# Patient Record
Sex: Female | Born: 1975 | Race: Black or African American | Hispanic: No | Smoking: Never smoker
Health system: Southern US, Community
[De-identification: ages and names within clinical notes are randomized; demographics above are authoritative.]

## PROBLEM LIST (undated history)

## (undated) DIAGNOSIS — N923 Ovulation bleeding: Secondary | ICD-10-CM

## (undated) DIAGNOSIS — E559 Vitamin D deficiency, unspecified: Secondary | ICD-10-CM

## (undated) DIAGNOSIS — E8881 Metabolic syndrome: Secondary | ICD-10-CM

## (undated) DIAGNOSIS — D219 Benign neoplasm of connective and other soft tissue, unspecified: Secondary | ICD-10-CM

## (undated) DIAGNOSIS — F419 Anxiety disorder, unspecified: Secondary | ICD-10-CM

## (undated) DIAGNOSIS — I890 Lymphedema, not elsewhere classified: Secondary | ICD-10-CM

## (undated) DIAGNOSIS — E7800 Pure hypercholesterolemia, unspecified: Secondary | ICD-10-CM

## (undated) DIAGNOSIS — O139 Gestational [pregnancy-induced] hypertension without significant proteinuria, unspecified trimester: Secondary | ICD-10-CM

## (undated) DIAGNOSIS — E88819 Insulin resistance, unspecified: Secondary | ICD-10-CM

## (undated) DIAGNOSIS — N840 Polyp of corpus uteri: Secondary | ICD-10-CM

## (undated) DIAGNOSIS — N760 Acute vaginitis: Secondary | ICD-10-CM

## (undated) DIAGNOSIS — I10 Essential (primary) hypertension: Secondary | ICD-10-CM

## (undated) DIAGNOSIS — A5901 Trichomonal vulvovaginitis: Secondary | ICD-10-CM

## (undated) DIAGNOSIS — O1410 Severe pre-eclampsia, unspecified trimester: Secondary | ICD-10-CM

## (undated) DIAGNOSIS — N920 Excessive and frequent menstruation with regular cycle: Secondary | ICD-10-CM

## (undated) DIAGNOSIS — D6859 Other primary thrombophilia: Secondary | ICD-10-CM

## (undated) DIAGNOSIS — D759 Disease of blood and blood-forming organs, unspecified: Secondary | ICD-10-CM

## (undated) DIAGNOSIS — B9689 Other specified bacterial agents as the cause of diseases classified elsewhere: Secondary | ICD-10-CM

## (undated) HISTORY — DX: Lymphedema, not elsewhere classified: I89.0

## (undated) HISTORY — DX: Benign neoplasm of connective and other soft tissue, unspecified: D21.9

## (undated) HISTORY — DX: Other specified bacterial agents as the cause of diseases classified elsewhere: B96.89

## (undated) HISTORY — DX: Excessive and frequent menstruation with regular cycle: N92.0

## (undated) HISTORY — PX: WISDOM TOOTH EXTRACTION: SHX21

## (undated) HISTORY — DX: Vitamin D deficiency, unspecified: E55.9

## (undated) HISTORY — DX: Anxiety disorder, unspecified: F41.9

## (undated) HISTORY — DX: Trichomonal vulvovaginitis: A59.01

## (undated) HISTORY — PX: ANKLE SURGERY: SHX546

## (undated) HISTORY — DX: Acute vaginitis: N76.0

## (undated) HISTORY — DX: Polyp of corpus uteri: N84.0

## (undated) HISTORY — DX: Ovulation bleeding: N92.3

---

## 1999-04-22 ENCOUNTER — Emergency Department (HOSPITAL_COMMUNITY): Admission: EM | Admit: 1999-04-22 | Discharge: 1999-04-22 | Payer: Self-pay | Admitting: Emergency Medicine

## 1999-04-22 ENCOUNTER — Encounter: Payer: Self-pay | Admitting: Emergency Medicine

## 1999-09-11 ENCOUNTER — Emergency Department (HOSPITAL_COMMUNITY): Admission: EM | Admit: 1999-09-11 | Discharge: 1999-09-11 | Payer: Self-pay | Admitting: Emergency Medicine

## 1999-12-27 ENCOUNTER — Other Ambulatory Visit: Admission: RE | Admit: 1999-12-27 | Discharge: 1999-12-27 | Payer: Self-pay | Admitting: Obstetrics and Gynecology

## 2001-01-08 ENCOUNTER — Other Ambulatory Visit: Admission: RE | Admit: 2001-01-08 | Discharge: 2001-01-08 | Payer: Self-pay | Admitting: Obstetrics and Gynecology

## 2001-04-03 ENCOUNTER — Encounter: Payer: Self-pay | Admitting: Internal Medicine

## 2001-04-03 ENCOUNTER — Encounter: Admission: RE | Admit: 2001-04-03 | Discharge: 2001-04-03 | Payer: Self-pay | Admitting: Internal Medicine

## 2001-07-17 ENCOUNTER — Encounter: Payer: Self-pay | Admitting: *Deleted

## 2001-07-17 ENCOUNTER — Ambulatory Visit (HOSPITAL_COMMUNITY): Admission: RE | Admit: 2001-07-17 | Discharge: 2001-07-17 | Payer: Self-pay | Admitting: *Deleted

## 2001-08-31 ENCOUNTER — Emergency Department (HOSPITAL_COMMUNITY): Admission: EM | Admit: 2001-08-31 | Discharge: 2001-08-31 | Payer: Self-pay | Admitting: Emergency Medicine

## 2002-02-04 ENCOUNTER — Encounter: Payer: Self-pay | Admitting: *Deleted

## 2002-02-04 ENCOUNTER — Ambulatory Visit (HOSPITAL_COMMUNITY): Admission: RE | Admit: 2002-02-04 | Discharge: 2002-02-04 | Payer: Self-pay | Admitting: *Deleted

## 2002-03-03 ENCOUNTER — Inpatient Hospital Stay (HOSPITAL_COMMUNITY): Admission: RE | Admit: 2002-03-03 | Discharge: 2002-03-06 | Payer: Self-pay | Admitting: *Deleted

## 2002-11-09 ENCOUNTER — Emergency Department (HOSPITAL_COMMUNITY): Admission: EM | Admit: 2002-11-09 | Discharge: 2002-11-09 | Payer: Self-pay | Admitting: Emergency Medicine

## 2002-11-09 ENCOUNTER — Encounter: Payer: Self-pay | Admitting: Emergency Medicine

## 2002-11-17 ENCOUNTER — Ambulatory Visit (HOSPITAL_BASED_OUTPATIENT_CLINIC_OR_DEPARTMENT_OTHER): Admission: RE | Admit: 2002-11-17 | Discharge: 2002-11-17 | Payer: Self-pay | Admitting: Orthopedic Surgery

## 2003-03-16 ENCOUNTER — Ambulatory Visit (HOSPITAL_BASED_OUTPATIENT_CLINIC_OR_DEPARTMENT_OTHER): Admission: RE | Admit: 2003-03-16 | Discharge: 2003-03-16 | Payer: Self-pay | Admitting: Orthopedic Surgery

## 2003-04-14 ENCOUNTER — Other Ambulatory Visit: Admission: RE | Admit: 2003-04-14 | Discharge: 2003-04-14 | Payer: Self-pay | Admitting: Obstetrics and Gynecology

## 2004-02-04 ENCOUNTER — Other Ambulatory Visit: Admission: RE | Admit: 2004-02-04 | Discharge: 2004-02-04 | Payer: Self-pay | Admitting: Obstetrics and Gynecology

## 2004-02-10 ENCOUNTER — Inpatient Hospital Stay (HOSPITAL_COMMUNITY): Admission: AD | Admit: 2004-02-10 | Discharge: 2004-02-10 | Payer: Self-pay | Admitting: Obstetrics and Gynecology

## 2004-03-16 ENCOUNTER — Encounter: Admission: RE | Admit: 2004-03-16 | Discharge: 2004-06-15 | Payer: Self-pay | Admitting: Obstetrics and Gynecology

## 2004-03-26 ENCOUNTER — Inpatient Hospital Stay (HOSPITAL_COMMUNITY): Admission: AD | Admit: 2004-03-26 | Discharge: 2004-03-26 | Payer: Self-pay | Admitting: Obstetrics & Gynecology

## 2004-03-27 ENCOUNTER — Encounter (HOSPITAL_COMMUNITY): Admission: RE | Admit: 2004-03-27 | Discharge: 2004-04-26 | Payer: Self-pay | Admitting: Obstetrics and Gynecology

## 2004-04-29 ENCOUNTER — Encounter (HOSPITAL_COMMUNITY): Admission: RE | Admit: 2004-04-29 | Discharge: 2004-05-29 | Payer: Self-pay | Admitting: Obstetrics and Gynecology

## 2004-04-30 ENCOUNTER — Inpatient Hospital Stay (HOSPITAL_COMMUNITY): Admission: AD | Admit: 2004-04-30 | Discharge: 2004-04-30 | Payer: Self-pay | Admitting: Obstetrics & Gynecology

## 2004-05-11 ENCOUNTER — Inpatient Hospital Stay (HOSPITAL_COMMUNITY): Admission: AD | Admit: 2004-05-11 | Discharge: 2004-05-12 | Payer: Self-pay | Admitting: Obstetrics & Gynecology

## 2004-06-14 ENCOUNTER — Ambulatory Visit: Payer: Self-pay | Admitting: Obstetrics and Gynecology

## 2004-06-15 ENCOUNTER — Ambulatory Visit: Payer: Self-pay | Admitting: Obstetrics & Gynecology

## 2004-06-21 ENCOUNTER — Ambulatory Visit: Payer: Self-pay | Admitting: Obstetrics & Gynecology

## 2004-06-26 ENCOUNTER — Inpatient Hospital Stay (HOSPITAL_COMMUNITY): Admission: AD | Admit: 2004-06-26 | Discharge: 2004-06-26 | Payer: Self-pay | Admitting: Obstetrics and Gynecology

## 2004-06-26 ENCOUNTER — Ambulatory Visit: Payer: Self-pay | Admitting: Obstetrics and Gynecology

## 2004-07-03 ENCOUNTER — Ambulatory Visit: Payer: Self-pay | Admitting: Obstetrics & Gynecology

## 2004-07-10 ENCOUNTER — Ambulatory Visit: Payer: Self-pay | Admitting: Obstetrics and Gynecology

## 2004-07-17 ENCOUNTER — Ambulatory Visit: Payer: Self-pay | Admitting: Obstetrics & Gynecology

## 2004-07-24 ENCOUNTER — Ambulatory Visit: Payer: Self-pay | Admitting: Obstetrics & Gynecology

## 2004-07-28 ENCOUNTER — Inpatient Hospital Stay (HOSPITAL_COMMUNITY): Admission: AD | Admit: 2004-07-28 | Discharge: 2004-07-28 | Payer: Self-pay | Admitting: Obstetrics and Gynecology

## 2004-07-29 ENCOUNTER — Inpatient Hospital Stay (HOSPITAL_COMMUNITY): Admission: AD | Admit: 2004-07-29 | Discharge: 2004-07-29 | Payer: Self-pay | Admitting: Obstetrics & Gynecology

## 2004-08-02 ENCOUNTER — Ambulatory Visit: Payer: Self-pay | Admitting: Obstetrics and Gynecology

## 2004-08-11 ENCOUNTER — Inpatient Hospital Stay (HOSPITAL_COMMUNITY): Admission: AD | Admit: 2004-08-11 | Discharge: 2004-08-16 | Payer: Self-pay | Admitting: Obstetrics and Gynecology

## 2004-08-11 ENCOUNTER — Ambulatory Visit: Payer: Self-pay | Admitting: Obstetrics and Gynecology

## 2004-08-13 ENCOUNTER — Encounter: Payer: Self-pay | Admitting: Obstetrics and Gynecology

## 2004-08-31 ENCOUNTER — Ambulatory Visit: Payer: Self-pay | Admitting: Hematology & Oncology

## 2004-12-29 ENCOUNTER — Emergency Department (HOSPITAL_COMMUNITY): Admission: EM | Admit: 2004-12-29 | Discharge: 2004-12-29 | Payer: Self-pay | Admitting: Emergency Medicine

## 2005-06-22 ENCOUNTER — Emergency Department (HOSPITAL_COMMUNITY): Admission: EM | Admit: 2005-06-22 | Discharge: 2005-06-22 | Payer: Self-pay | Admitting: Emergency Medicine

## 2005-06-24 ENCOUNTER — Emergency Department (HOSPITAL_COMMUNITY): Admission: EM | Admit: 2005-06-24 | Discharge: 2005-06-25 | Payer: Self-pay | Admitting: *Deleted

## 2005-10-05 ENCOUNTER — Other Ambulatory Visit: Admission: RE | Admit: 2005-10-05 | Discharge: 2005-10-05 | Payer: Self-pay | Admitting: Obstetrics and Gynecology

## 2006-09-24 DIAGNOSIS — N923 Ovulation bleeding: Secondary | ICD-10-CM

## 2006-09-24 DIAGNOSIS — D219 Benign neoplasm of connective and other soft tissue, unspecified: Secondary | ICD-10-CM

## 2006-09-24 HISTORY — DX: Ovulation bleeding: N92.3

## 2006-09-24 HISTORY — DX: Benign neoplasm of connective and other soft tissue, unspecified: D21.9

## 2010-09-24 DIAGNOSIS — N920 Excessive and frequent menstruation with regular cycle: Secondary | ICD-10-CM

## 2010-09-24 DIAGNOSIS — E559 Vitamin D deficiency, unspecified: Secondary | ICD-10-CM

## 2010-09-24 DIAGNOSIS — N840 Polyp of corpus uteri: Secondary | ICD-10-CM

## 2010-09-24 DIAGNOSIS — A5901 Trichomonal vulvovaginitis: Secondary | ICD-10-CM

## 2010-09-24 HISTORY — DX: Polyp of corpus uteri: N84.0

## 2010-09-24 HISTORY — DX: Trichomonal vulvovaginitis: A59.01

## 2010-09-24 HISTORY — DX: Excessive and frequent menstruation with regular cycle: N92.0

## 2010-09-24 HISTORY — DX: Vitamin D deficiency, unspecified: E55.9

## 2011-03-23 ENCOUNTER — Encounter (HOSPITAL_COMMUNITY): Payer: Self-pay | Admitting: *Deleted

## 2011-04-08 MED ORDER — CEFAZOLIN SODIUM-DEXTROSE 2-3 GM-% IV SOLR
2.0000 g | INTRAVENOUS | Status: AC
  Administered 2011-04-09: 2 g via INTRAVENOUS
  Filled 2011-04-08: qty 50

## 2011-04-09 ENCOUNTER — Encounter (HOSPITAL_COMMUNITY): Payer: Self-pay | Admitting: Anesthesiology

## 2011-04-09 ENCOUNTER — Encounter (HOSPITAL_COMMUNITY)
Admission: RE | Disposition: A | Discharge status: Home / Self Care | Payer: Self-pay | Source: Ambulatory Visit | Attending: Obstetrics and Gynecology

## 2011-04-09 ENCOUNTER — Other Ambulatory Visit: Payer: Self-pay | Admitting: Obstetrics and Gynecology

## 2011-04-09 ENCOUNTER — Ambulatory Visit (HOSPITAL_COMMUNITY)
Admission: RE | Admit: 2011-04-09 | Discharge: 2011-04-09 | Disposition: A | Discharge status: Home / Self Care | Payer: 59 | Source: Ambulatory Visit | Attending: Obstetrics and Gynecology | Admitting: Obstetrics and Gynecology

## 2011-04-09 ENCOUNTER — Ambulatory Visit (HOSPITAL_COMMUNITY): Payer: 59 | Admitting: Anesthesiology

## 2011-04-09 DIAGNOSIS — Z302 Encounter for sterilization: Secondary | ICD-10-CM | POA: Insufficient documentation

## 2011-04-09 DIAGNOSIS — N840 Polyp of corpus uteri: Secondary | ICD-10-CM | POA: Insufficient documentation

## 2011-04-09 DIAGNOSIS — N920 Excessive and frequent menstruation with regular cycle: Secondary | ICD-10-CM | POA: Insufficient documentation

## 2011-04-09 HISTORY — PX: LAPAROSCOPIC TUBAL LIGATION: SHX1937

## 2011-04-09 HISTORY — PX: NOVASURE ABLATION: SHX5394

## 2011-04-09 HISTORY — DX: Essential (primary) hypertension: I10

## 2011-04-09 LAB — PREGNANCY, URINE: Preg Test, Ur: NEGATIVE

## 2011-04-09 LAB — SURGICAL PCR SCREEN
MRSA, PCR: NEGATIVE
Staphylococcus aureus: NEGATIVE

## 2011-04-09 LAB — CBC
Platelets: 303 10*3/uL (ref 150–400)
RBC: 4.2 MIL/uL (ref 3.87–5.11)
WBC: 9 10*3/uL (ref 4.0–10.5)

## 2011-04-09 SURGERY — DILATATION & CURETTAGE/HYSTEROSCOPY WITH RESECTOCOPE
Anesthesia: General | Wound class: Clean Contaminated

## 2011-04-09 MED ORDER — CHLOROPROCAINE HCL 1 % IJ SOLN
INTRAMUSCULAR | Status: DC | PRN
  Administered 2011-04-09: 18 mL

## 2011-04-09 MED ORDER — OXYCODONE-ACETAMINOPHEN 5-325 MG PO TABS
ORAL_TABLET | ORAL | Status: AC
  Administered 2011-04-09: 1
  Filled 2011-04-09: qty 1

## 2011-04-09 MED ORDER — NEOSTIGMINE METHYLSULFATE 1 MG/ML IJ SOLN
INTRAMUSCULAR | Status: DC | PRN
  Administered 2011-04-09: 2 mg via INTRAMUSCULAR

## 2011-04-09 MED ORDER — ROCURONIUM BROMIDE 100 MG/10ML IV SOLN
INTRAVENOUS | Status: DC | PRN
  Administered 2011-04-09 (×2): 5 mg via INTRAVENOUS
  Administered 2011-04-09: 35 mg via INTRAVENOUS
  Administered 2011-04-09: 5 mg via INTRAVENOUS

## 2011-04-09 MED ORDER — ONDANSETRON HCL 4 MG/2ML IJ SOLN
INTRAMUSCULAR | Status: DC | PRN
  Administered 2011-04-09: 4 mg via INTRAVENOUS

## 2011-04-09 MED ORDER — FENTANYL CITRATE 0.05 MG/ML IJ SOLN
INTRAMUSCULAR | Status: AC
  Administered 2011-04-09: 50 ug via INTRAVENOUS
  Filled 2011-04-09: qty 2

## 2011-04-09 MED ORDER — OXYCODONE-ACETAMINOPHEN 5-325 MG PO TABS: 1.0000 | ORAL_TABLET | ORAL | Status: DC | PRN

## 2011-04-09 MED ORDER — MIDAZOLAM HCL 2 MG/2ML IJ SOLN: 0.5000 mg | INTRAMUSCULAR | Status: DC | PRN

## 2011-04-09 MED ORDER — KETOROLAC TROMETHAMINE 60 MG/2ML IM SOLN
INTRAMUSCULAR | Status: DC | PRN
  Administered 2011-04-09: 30 mg via INTRAMUSCULAR

## 2011-04-09 MED ORDER — HEPARIN SODIUM (PORCINE) 5000 UNIT/ML IJ SOLN
5000.0000 [IU] | INTRAMUSCULAR | Status: DC
  Administered 2011-04-09: 5000 [IU] via SUBCUTANEOUS

## 2011-04-09 MED ORDER — FENTANYL CITRATE 0.05 MG/ML IJ SOLN
INTRAMUSCULAR | Status: DC | PRN
Start: 2011-04-09 — End: 2011-04-09

## 2011-04-09 MED ORDER — DEXAMETHASONE SODIUM PHOSPHATE 10 MG/ML IJ SOLN
INTRAMUSCULAR | Status: DC | PRN
  Administered 2011-04-09: 10 mg via INTRAVENOUS

## 2011-04-09 MED ORDER — BUPIVACAINE HCL (PF) 0.25 % IJ SOLN
INTRAMUSCULAR | Status: DC | PRN
  Administered 2011-04-09: 10 mL

## 2011-04-09 MED ORDER — GLYCINE 1.5 % IR SOLN
Status: DC | PRN
  Administered 2011-04-09: 500 mL

## 2011-04-09 MED ORDER — FENTANYL CITRATE 0.05 MG/ML IJ SOLN
INTRAMUSCULAR | Status: DC | PRN
  Administered 2011-04-09: 50 ug via INTRAVENOUS
  Administered 2011-04-09: 100 ug via INTRAVENOUS
  Administered 2011-04-09 (×2): 50 ug via INTRAVENOUS

## 2011-04-09 MED ORDER — FENTANYL CITRATE 0.05 MG/ML IJ SOLN
50.0000 ug | INTRAMUSCULAR | Status: DC | PRN
  Administered 2011-04-09 (×3): 50 ug via INTRAVENOUS

## 2011-04-09 MED ORDER — LIDOCAINE HCL (CARDIAC) 20 MG/ML IV SOLN
INTRAVENOUS | Status: DC | PRN
  Administered 2011-04-09: 60 mg via INTRAVENOUS

## 2011-04-09 MED ORDER — LACTATED RINGERS IV SOLN
INTRAVENOUS | Status: DC
  Administered 2011-04-09 (×2): via INTRAVENOUS

## 2011-04-09 MED ORDER — PROPOFOL 10 MG/ML IV EMUL
INTRAVENOUS | Status: DC | PRN
  Administered 2011-04-09: 100 mg via INTRAVENOUS
  Administered 2011-04-09: 180 mg via INTRAVENOUS
  Administered 2011-04-09: 50 mg via INTRAVENOUS
  Administered 2011-04-09: 20 mg via INTRAVENOUS

## 2011-04-09 MED ORDER — GLYCOPYRROLATE 0.2 MG/ML IJ SOLN
INTRAMUSCULAR | Status: DC | PRN
  Administered 2011-04-09: .4 mg via INTRAVENOUS
  Administered 2011-04-09: 0.1 mg via INTRAVENOUS

## 2011-04-09 MED ORDER — KETOROLAC TROMETHAMINE 30 MG/ML IJ SOLN
INTRAMUSCULAR | Status: DC | PRN
  Administered 2011-04-09: 30 mg via INTRAVENOUS

## 2011-04-09 MED ORDER — IBUPROFEN 600 MG PO TABS: 600.0000 mg | ORAL_TABLET | Freq: Four times a day (QID) | ORAL | Status: AC | PRN

## 2011-04-09 MED ORDER — MIDAZOLAM HCL 5 MG/5ML IJ SOLN
INTRAMUSCULAR | Status: DC | PRN
  Administered 2011-04-09: 2 mg via INTRAVENOUS

## 2011-04-09 MED ORDER — FENTANYL CITRATE 0.05 MG/ML IJ SOLN
INTRAMUSCULAR | Status: AC
  Filled 2011-04-09: qty 2

## 2011-04-09 MED ORDER — OXYCODONE-ACETAMINOPHEN 5-500 MG PO CAPS
ORAL_CAPSULE | ORAL | Status: AC
Start: 2011-04-09 — End: 2011-04-19

## 2011-04-09 SURGICAL SUPPLY — 36 items
ABLATOR ENDOMETRIAL BIPOLAR (ABLATOR) ×3 IMPLANT
BOOTIES KNEE HIGH SLOAN (MISCELLANEOUS) ×6 IMPLANT
CANISTER SUCTION 2500CC (MISCELLANEOUS) ×3 IMPLANT
CATH ROBINSON RED A/P 16FR (CATHETERS) ×3 IMPLANT
CHLORAPREP W/TINT 26ML (MISCELLANEOUS) ×3 IMPLANT
CLOTH BEACON ORANGE TIMEOUT ST (SAFETY) ×3 IMPLANT
CONTAINER PREFILL 10% NBF 60ML (FORM) ×6 IMPLANT
CORD ACTIVE DISPOSABLE (ELECTRODE) ×1
CORD ELECTRO ACTIVE DISP (ELECTRODE) ×2 IMPLANT
DERMABOND ADVANCED (GAUZE/BANDAGES/DRESSINGS) ×1
DERMABOND ADVANCED .7 DNX12 (GAUZE/BANDAGES/DRESSINGS) ×2 IMPLANT
DRAPE UTILITY XL STRL (DRAPES) ×6 IMPLANT
ELECT LOOP GYNE PRO 24FR (CUTTING LOOP) ×3
ELECT REM PT RETURN 9FT ADLT (ELECTROSURGICAL) ×3
ELECT VAPORTRODE GRVD BAR (ELECTRODE) IMPLANT
ELECTRODE LOOP GYNE PRO 24FR (CUTTING LOOP) ×2 IMPLANT
ELECTRODE REM PT RTRN 9FT ADLT (ELECTROSURGICAL) ×2 IMPLANT
GLOVE BIOGEL PI IND STRL 6.5 (GLOVE) ×4 IMPLANT
GLOVE BIOGEL PI IND STRL 7.0 (GLOVE) ×4 IMPLANT
GLOVE BIOGEL PI INDICATOR 6.5 (GLOVE) ×2
GLOVE BIOGEL PI INDICATOR 7.0 (GLOVE) ×2
GLOVE ECLIPSE 6.5 STRL STRAW (GLOVE) ×6 IMPLANT
GLOVE INDICATOR 7.5 STRL GRN (GLOVE) ×3 IMPLANT
GLOVE SURG SS PI 7.0 STRL IVOR (GLOVE) ×6 IMPLANT
GOWN BRE IMP SLV AUR LG STRL (GOWN DISPOSABLE) ×6 IMPLANT
NEEDLE SPNL 22GX3.5 QUINCKE BK (NEEDLE) ×3 IMPLANT
NEEDLE SPNL 22GX7 QUINCKE BK (NEEDLE) IMPLANT
PACK HYSTEROSCOPY LF (CUSTOM PROCEDURE TRAY) ×3 IMPLANT
PACK LAPAROSCOPY BASIN (CUSTOM PROCEDURE TRAY) ×3 IMPLANT
PAD PREP 24X48 CUFFED NSTRL (MISCELLANEOUS) ×3 IMPLANT
SUT MNCRL AB 3-0 PS2 27 (SUTURE) ×3 IMPLANT
SUT VIC AB 2-0 CT1 (SUTURE) ×3 IMPLANT
SUT VICRYL 0 UR6 27IN ABS (SUTURE) ×3 IMPLANT
TOWEL OR 17X24 6PK STRL BLUE (TOWEL DISPOSABLE) ×6 IMPLANT
TROCAR BALLN 12MMX100 BLUNT (TROCAR) ×3 IMPLANT
WATER STERILE IRR 1000ML POUR (IV SOLUTION) ×3 IMPLANT

## 2011-04-09 NOTE — Transfer of Care (Signed)
Immediate Anesthesia Transfer of Care Note  Patient: MICHEALA MORISSETTE  Procedure(s) Performed:  DILATATION & CURRETTAGE/HYSTEROSCOPY WITH RESECTOCOPE; NOVASURE ABLATION; LAPAROSCOPIC TUBAL LIGATION  Patient Location: PACU  Anesthesia Type: General  Level of Consciousness: awake, alert , oriented, patient cooperative and responds to stimulation  Airway & Oxygen Therapy: Patient Spontanous Breathing and Patient connected to nasal cannula oxygen  Post-op Assessment: Report given to PACU RN and Post -op Vital signs reviewed and stable  Post vital signs: Reviewed and stable  Complications: No apparent anesthesia complications

## 2011-04-09 NOTE — Anesthesia Preprocedure Evaluation (Addendum)
Anesthesia Evaluation  Name, MR# and DOB Patient awake  General Assessment Comment  Airway Mallampati: II TM Distance: >3 FB     Dental No notable dental hx (+) Dental Advisory Given   Pulmonaryneg pulmonary ROS      pulmonary exam normal   Cardiovascular Regular Normal   Neuro/PsychNegative Neurological ROS Negative Psych ROS  GI/Hepatic/Renal negative GI ROS, negative Liver ROS, and negative Renal ROS (+)       Endo/Other  Negative Endocrine ROS (+)   Abdominal Normal abdominal exam  (+)   Musculoskeletal negative musculoskeletal ROS (+)  Hematology negative hematology ROS (+)   Peds  Reproductive/Obstetrics   Anesthesia Other Findings             Anesthesia Physical Anesthesia Plan  ASA: II  Anesthesia Plan: General   Post-op Pain Management:    Induction: Intravenous  Airway Management Planned: Oral ETT  Additional Equipment:   Intra-op Plan:   Post-operative Plan: Extubation in OR  Informed Consent: I have reviewed the patients History and Physical, chart, labs and discussed the procedure including the risks, benefits and alternatives for the proposed anesthesia with the patient or authorized representative who has indicated his/her understanding and acceptance.   Dental advisory given  Plan Discussed with: CRNA  Anesthesia Plan Comments:         Anesthesia Quick Evaluation

## 2011-04-09 NOTE — H&P (Signed)
History and Physical Interval Note:   04/09/2011   8:20 AM   Lauren [name]  has presented today for surgery, with the diagnosis of menorrhagia, endometial polyp, desires sterilization  The various methods of treatment have been discussed with the patient and family. After consideration of risks, benefits and other options for treatment, the patient has consented to  Procedure(s): DILATATION & CURRETTAGE/HYSTEROSCOPY WITH RESECTOCOPE NOVASURE ABLATION LAPAROSCOPIC TUBAL LIGATION as a surgical intervention .  I have reviewed the patients' chart and labs.  Questions were answered to the patient's satisfaction.    Please refer to written  H & P  Kennidee Heyne A  MD

## 2011-04-09 NOTE — Anesthesia Procedure Notes (Addendum)
Procedure Name: Intubation Date/Time: 04/09/2011 9:39 AM Performed by: Cephus Shelling Pre-anesthesia Checklist: Patient identified, Patient being monitored, Timeout performed, Emergency Drugs available and Suction available Patient Re-evaluated:Patient Re-evaluated prior to inductionOxygen Delivery Method: Circle System Utilized Preoxygenation: Pre-oxygenation with 100% oxygen Intubation Type: IV induction and Inhalational induction Ventilation: Mask ventilation without difficulty Laryngoscope Size: Mac and 3 Grade View: Grade II Tube type: Oral Tube size: 7.0 mm Number of attempts: 1 Airway Equipment and Method: stylet Placement Confirmation: ETT inserted through vocal cords under direct vision,  positive ETCO2 and breath sounds checked- equal and bilateral Secured at: 21 cm Tube secured with: Tape Dental Injury: Teeth and Oropharynx as per pre-operative assessment

## 2011-04-09 NOTE — Op Note (Signed)
04/09/2011  11:31 AM  PATIENT:  Lauren [name]  35 y.o. female  PRE-OPERATIVE DIAGNOSIS:  menorrhagia, endometial polyp, desires sterilization  POST-OPERATIVE DIAGNOSIS:  Same and pelvic adhesions  PROCEDURE:  Procedure(s): DILATATION & CURRETTAGE/HYSTEROSCOPY WITH RESECTOCOPE NOVASURE ABLATION LAPAROSCOPIC TUBAL LIGATION LYSIS OF ADHESIONS  SURGEON:  Surgeon(s): Crist Fat Shooter Tangen MD  ASSISTANTS: none   ANESTHESIA:   local and general  ESTIMATED BLOOD LOSS: * No blood loss amount entered *   LOCAL MEDICATIONS USED:  MARCAINE 10 CC         NESACAINE 18 CC  SPECIMEN:  Source of Specimen:  endometrial polyps and endometrial curettings  DISPOSITION OF SPECIMEN:  PATHOLOGY  COUNTS:  YES and ACTION TAKEN: None  EBL: Minimal  DICTATION #: [id number]  PATIENT DISPOSITION:  PACU - hemodynamically stable.    Sigourney Portillo A MD 04/09/2011 11:31 AM

## 2011-04-09 NOTE — Discharge Summary (Signed)
Physician Discharge Summary  Patient ID: Lauren [name] MRN: [id number] DOB/AGE: 05/30/1976 35 y.o.  Admit date: 04/09/2011 Discharge date: 04/09/2011  Admission Diagnoses: menorrhagia, endometial polyp, desires sterilization  Discharge Diagnoses: menorrhagia, endometial polyp, desires sterilization        Active Problems:  * No active hospital problems. *    Discharged Condition: stable  Hospital Course:   Consults: none  Treatments: surgery: Hysteroscopy with dilation and curettage,Resection of endometrial polyp, Endometrial ablation with Novasure, Laparoscopic tubal ligation, Lysis of adhesions  Disposition: home   Current Discharge Medication List    START taking these medications   Details  ibuprofen (ADVIL,MOTRIN) 600 MG tablet Take 1 tablet (600 mg total) by mouth every 6 (six) hours as needed for pain. Qty: 30 tablet, Refills: 0    oxyCODONE-acetaminophen (TYLOX) 5-500 MG per capsule Take 1 to 2 capsules by mouth every 4 hours as needed for pain Qty: 24 capsule, Refills: 0      CONTINUE these medications which have NOT CHANGED   Details  ALPRAZolam (XANAX) 0.25 MG tablet Take 0.25 mg by mouth at bedtime as needed. For anxiety.     aspirin 81 MG tablet Take 81 mg by mouth daily.      ergocalciferol (VITAMIN D2) 50000 UNITS capsule Take 50,000 Units by mouth 2 (two) times a week.      lisinopril-hydrochlorothiazide (PRINZIDE,ZESTORETIC) 20-12.5 MG per tablet Take 1 tablet by mouth daily.      Omega-3 Fatty Acids (FISH OIL) 1000 MG CAPS Take 1 capsule by mouth daily.        Follow-up Information    Follow up with [name] (see instructions)    Contact information:   [address] [place], [place]   [address] [phone]          Signed: Silverio Lay A 04/09/2011, 11:57 AM

## 2011-04-09 NOTE — Anesthesia Postprocedure Evaluation (Signed)
Vital signs stable Patient alert Pain and nausea are controlled No apparent anesthetic complications No follow up care needed 

## 2011-04-09 NOTE — Op Note (Signed)
Lauren [name], Lauren [name]              ACCOUNT NO.:  [id number]  MEDICAL RECORD NO.:  [id number]  LOCATION:  [facility]                          FACILITY:  [facility]  PHYSICIAN:  Dois Davenport A. Kyre Jeffries, M.D. DATE OF BIRTH:  03-07-1976  DATE OF PROCEDURE:  04/09/2011 DATE OF DISCHARGE:  04/09/2011                              OPERATIVE REPORT   PREOPERATIVE DIAGNOSIS:  Menorrhagia with desire for sterilization.  POSTOPERATIVE DIAGNOSIS:  Menorrhagia with desire for sterilization with pelvic adhesions.  ANESTHESIA:  General.  PROCEDURE:  Hysteroscopy, resection of endometrial polyp, D and C, endometrial ablation with NovaSure, laparoscopic tubal sterilization, and lysis of adhesion.  SURGEON:  Crist Fat. Conna Terada, MD.  ASSISTANT:  None.  ESTIMATED BLOOD LOSS:  Minimal.  DESCRIPTION OF PROCEDURE:  After being informed of the planned procedure with possible complications including bleeding, infection, injury to other organs, failure of treatment, irreversibility of tubal as well as failure of tubal at a rate of 1 in 500, informed consent is obtained. The patient is taken to OR #8, given general anesthesia with endotracheal intubation without any complication.  She is placed in the lithotomy position, prepped and draped in a sterile fashion, and a Foley catheter is inserted in her bladder.  Pelvic exam reveals an anteverted uterus, normal in size and shape, and two normal adnexa.  A weighted speculum is inserted in the vagina and the anterior lip of the cervix was grasped with a tenaculum forceps.  The uterus is sounded at 9 cm and the cervix was measured at 2.5 cm.  We proceed with systematic dilatation of the cervix using Hegar dilator until #33 which allows easy placement of an operative hysteroscope.  With perfusion of sorbitol at a maximum pressure of 100 mmHg, we are able to visualize the uterine cavity which reveals two small polyps in the right cornua.  No other pathology.  Because of the  thickness of the lining, we removed our hysteroscope, proceed with a sharp curettage of the endometrial cavity, reinsert our hysteroscope to confirm previously described findings. Now, we proceed with resection using the loop of the two small polyps in the right cornua.  Once this was completed, we then flushed the uterine cavity with LR and removed the hysteroscope.  NovaSure was then inserted in the endometrial cavity, set at 6.5, it is deployed.  The uterine width is 3.5 cm.  It is triggered, fired over 55 seconds.  The NovaSure instrument was then removed.  We reinserted the hysteroscope to note a completely blanched endometrial cavity.  Hysteroscope is removed and we insert a Zumi intrauterine manipulator, which balloon is inflated with 5 mL of saline.  Tenaculum was removed and a small laceration on the anterior lip of the cervix is repaired with a figure-of-eight stitch of 2-0 Vicryl.  We then proceed with the laparoscopy.  The umbilical area is infiltrated with 10 mL of Marcaine 0.25.  We proceed with a semi-elliptical incision, which is brought down bluntly to the fascia.  The fascia is identified, grasped with Kocher forceps, incised with Mayo scissors. Peritoneum is entered bluntly.  A pursestring suture of 0 Vicryl was placed on the fascia and a 10-mm Hasson trocar is inserted in  the abdominal cavity, held in place with its balloon, inflated with 15 mL of air, and the previously placed pursestring suture.  This allows for easy entry of an operative laparoscope.  OBSERVATION:  Anterior cul-de-sac is normal.  Uterus is normal. Posterior cul-de-sac is normal.  Left tube and left ovary normal.  Right tube and right ovary are normal, although, the ovary is adherent with a small band to the sigmoid colon.  We proceed with cauterization of both tubes in the isthmic-ampullary area on the distance of 1.5 cm including mesosalpinx.  We then proceed with sharp lysis of this  previously described adhesions between the right ovary and the sigmoid without difficulty.  The rest of the observation reveals a normal liver and normal gallbladder.  Appendix is not visualized.  All instruments were then removed after evacuating the pneumoperitoneum and the fascia is closed with a previously placed pursestring suture of 0 Vicryl.  The skin is closed with two subcuticular suture of 3-0 Monocryl and Dermabond.  Instruments and sponge count is complete x2.  Estimated blood loss is minimal.  The procedure is well tolerated by the patient, who is taken to recovery room in a well and stable condition.  SPECIMEN:  Endometrial polyps and endometrial curettings.  Both sent to pathology.     Crist Fat Johnesha Acheampong, M.D.     SAR/MEDQ  D:  04/09/2011  T:  04/09/2011  Job:  [id number]

## 2011-04-25 ENCOUNTER — Encounter (HOSPITAL_COMMUNITY): Payer: Self-pay | Admitting: Obstetrics and Gynecology

## 2011-12-12 ENCOUNTER — Ambulatory Visit
Admission: RE | Admit: 2011-12-12 | Discharge: 2011-12-12 | Disposition: A | Discharge status: Home / Self Care | Payer: 59 | Source: Ambulatory Visit | Attending: Family Medicine | Admitting: Family Medicine

## 2011-12-12 ENCOUNTER — Other Ambulatory Visit: Payer: Self-pay | Admitting: Family Medicine

## 2011-12-12 DIAGNOSIS — R200 Anesthesia of skin: Secondary | ICD-10-CM

## 2012-01-21 ENCOUNTER — Ambulatory Visit: Payer: Self-pay | Admitting: Obstetrics and Gynecology

## 2012-02-26 ENCOUNTER — Inpatient Hospital Stay (HOSPITAL_COMMUNITY)
Admission: AD | Admit: 2012-02-26 | Discharge: 2012-02-26 | Disposition: A | Discharge status: Home / Self Care | Payer: 59 | Source: Ambulatory Visit | Attending: Obstetrics and Gynecology | Admitting: Obstetrics and Gynecology

## 2012-02-26 ENCOUNTER — Encounter (HOSPITAL_COMMUNITY): Payer: Self-pay

## 2012-02-26 ENCOUNTER — Telehealth: Payer: Self-pay | Admitting: Obstetrics and Gynecology

## 2012-02-26 DIAGNOSIS — N949 Unspecified condition associated with female genital organs and menstrual cycle: Secondary | ICD-10-CM | POA: Insufficient documentation

## 2012-02-26 DIAGNOSIS — N390 Urinary tract infection, site not specified: Secondary | ICD-10-CM

## 2012-02-26 DIAGNOSIS — R109 Unspecified abdominal pain: Secondary | ICD-10-CM | POA: Insufficient documentation

## 2012-02-26 DIAGNOSIS — A5901 Trichomonal vulvovaginitis: Secondary | ICD-10-CM | POA: Insufficient documentation

## 2012-02-26 DIAGNOSIS — N938 Other specified abnormal uterine and vaginal bleeding: Secondary | ICD-10-CM | POA: Insufficient documentation

## 2012-02-26 HISTORY — DX: Other primary thrombophilia: D68.59

## 2012-02-26 HISTORY — DX: Pure hypercholesterolemia, unspecified: E78.00

## 2012-02-26 HISTORY — DX: Severe pre-eclampsia, unspecified trimester: O14.10

## 2012-02-26 HISTORY — DX: Gestational (pregnancy-induced) hypertension without significant proteinuria, unspecified trimester: O13.9

## 2012-02-26 LAB — URINALYSIS, ROUTINE W REFLEX MICROSCOPIC
Bilirubin Urine: NEGATIVE
Ketones, ur: NEGATIVE mg/dL
Nitrite: NEGATIVE
Protein, ur: NEGATIVE mg/dL
Specific Gravity, Urine: 1.015 (ref 1.005–1.030)
Urobilinogen, UA: 0.2 mg/dL (ref 0.0–1.0)

## 2012-02-26 LAB — WET PREP, GENITAL

## 2012-02-26 MED ORDER — METRONIDAZOLE 500 MG PO TABS: 2000.0000 mg | ORAL_TABLET | Freq: Once | ORAL | Status: AC

## 2012-02-26 NOTE — Telephone Encounter (Signed)
Had Novasure almost 1 yr ago and starting spotting bright red blood.  Not enough to change a liner every hour but more than just wiping.No IC x 3 months.  Pt wants to be seen by anyone today.  Attempted PC to offer appt tomorrow but NA and no VM.  Nothing available today.    ld

## 2012-02-26 NOTE — Discharge Instructions (Signed)
Trichomoniasis  Trichomoniasis is an infection, caused by the Trichomonas organism, that affects both women and men. In women, the outer female genitalia and the vagina are affected. In men, the penis is mainly affected, but the prostate and other reproductive organs can also be involved. Trichomoniasis is a sexually transmitted disease (STD) and is most often passed to another person through sexual contact. The majority of people who get trichomoniasis do so from a sexual encounter and are also at risk for other STDs.  CAUSES    Sexual intercourse with an infected partner.   It can be present in swimming pools or hot tubs.  SYMPTOMS    Abnormal gray-green frothy vaginal discharge in women.   Vaginal itching and irritation in women.   Itching and irritation of the area outside the vagina in women.   Penile discharge with or without pain in males.   Inflammation of the urethra (urethritis), causing painful urination.   Bleeding after sexual intercourse.  RELATED COMPLICATIONS   Pelvic inflammatory disease.   Infection of the uterus (endometritis).   Infertility.   Tubal (ectopic) pregnancy.   It can be associated with other STDs, including gonorrhea and chlamydia, hepatitis B, and HIV.  COMPLICATIONS DURING PREGNANCY   Early (premature) delivery.   Premature rupture of the membranes (PROM).   Low birth weight.  DIAGNOSIS    Visualization of Trichomonas under the microscope from the vagina discharge.   Ph of the vagina greater than 4.5, tested with a test tape.   Trich Rapid Test.   Culture of the organism, but this is not usually needed.   It may be found on a Pap test.   Having a "strawberry cervix,"which means the cervix looks very red like a strawberry.  TREATMENT    You may be given medication to fight the infection. Inform your caregiver if you could be or are pregnant. Some medications used to treat the infection should not be taken during pregnancy.   Over-the-counter medications or  creams to decrease itching or irritation may be recommended.   Your sexual partner will need to be treated if infected.  HOME CARE INSTRUCTIONS    Take all medication prescribed by your caregiver.   Take over-the-counter medication for itching or irritation as directed by your caregiver.   Do not have sexual intercourse while you have the infection.   Do not douche or wear tampons.   Discuss your infection with your partner, as your partner may have acquired the infection from you. Or, your partner may have been the person who transmitted the infection to you.   Have your sex partner examined and treated if necessary.   Practice safe, informed, and protected sex.   See your caregiver for other STD testing.  SEEK MEDICAL CARE IF:    You still have symptoms after you finish the medication.   You have an oral temperature above 102 F (38.9 C).   You develop belly (abdominal) pain.   You have pain when you urinate.   You have bleeding after sexual intercourse.   You develop a rash.   The medication makes you sick or makes you throw up (vomit).  Document Released: 03/06/2001 Document Revised: 08/30/2011 Document Reviewed: 04/01/2009  Exit[employer] Patient Information 2012 [employer].

## 2012-02-26 NOTE — Telephone Encounter (Signed)
Offered pt appt first thing in am and pt d/c.  Stated she had to be seen today and would go to the hosp.  Will call VL to give her heads up.  ld

## 2012-02-26 NOTE — MAU Provider Note (Signed)
Chief Complaint:  Vaginal Bleeding and Abdominal Pain    First Provider Initiated Contact with Patient 02/26/12 1601      Lauren [name] is  36 y.o. [id number].  No LMP recorded. Patient has had an ablation..  She presents complaining of Vaginal Bleeding and Abdominal Pain . Onset is described as sudden and has been present for  1 days. Reports bleeding on tissue after voiding this morning and small clots noted in toilet. Also reports intermittent subrapubic pain. States similar symptoms last month with UTI, diagnosed at Urgent Care. Reports STI screenings all negative at that visit and no sexual intercourse since. Denies fever, chills, nausea, vomiting, back pain, or dysuria. ~ 12 months s/p Novasure by Dr. Estanislado Pandy.  Obstetrical/Gynecological History: OB History    Grav Para Term Preterm Abortions TAB SAB Ect Mult Living   3 2  2 1 1   1 2       Past Medical History: Past Medical History  Diagnosis Date  . Hypertension     checks bp at home - 110/70  . Pregnancy induced hypertension   . Pre-eclampsia, severe     delivered stillbirth  . Protein S deficiency     Took lovenox with twin pregnancy  . Preterm labor   . Hypercholesteremia     Past Surgical History: Past Surgical History  Procedure Date  . Cesarean section 2005, 2003  . Ankle surgery 2000, 1999  . Novasure ablation 04/09/2011    Procedure: NOVASURE ABLATION;  Surgeon: Esmeralda Arthur, MD;  Location: [facility];  Service: Gynecology;  Laterality: N/A;  . Laparoscopic tubal ligation 04/09/2011    Procedure: LAPAROSCOPIC TUBAL LIGATION;  Surgeon: Esmeralda Arthur, MD;  Location: [facility];  Service: Gynecology;  Laterality: Bilateral;  . Wisdom tooth extraction     Family History: Family History  Problem Relation Age of Onset  . Anesthesia problems Neg Hx   . Hypotension Neg Hx   . Malignant hyperthermia Neg Hx   . Pseudochol deficiency Neg Hx     Social History: History  Substance Use Topics  . Smoking status: Never  Smoker   . Smokeless tobacco: Never Used  . Alcohol Use: 0.6 oz/week    1 Glasses of wine per week    Allergies: No Known Allergies  Prescriptions prior to admission  Medication Sig Dispense Refill  . aspirin 325 MG tablet Take 325 mg by mouth daily.      Marland Kitchen lisinopril-hydrochlorothiazide (PRINZIDE,ZESTORETIC) 20-12.5 MG per tablet Take 1 tablet by mouth daily.        . Omega-3 Fatty Acids (FISH OIL) 1000 MG CAPS Take 1 capsule by mouth daily.       . simvastatin (ZOCOR) 10 MG tablet Take 10 mg by mouth at bedtime.        Review of Systems - Negative except what has been reviewed in HPI  Physical Exam   Blood pressure 152/81, pulse 66, temperature 98.3 F (36.8 C), temperature source Oral, resp. rate 20, height 5\' 6"  (1.676 m), weight 235 lb (106.595 kg).  General: General appearance - alert, well appearing, and in no distress, oriented to person, place, and time, overweight and comfortable appearing, pleasant and smiling Mental status - alert, oriented to person, place, and time, normal mood, behavior, speech, dress, motor activity, and thought processes, affect appropriate to mood Abdomen - soft, nondistended, no masses or organomegaly tenderness noted suprapubic no CVA tenderness Focused Gynecological Exam: VULVA: normal appearing vulva with no masses, tenderness or lesions,  VAGINA: vaginal discharge - white, creamy and No bleeding or blood-tinged d/c noted in vault, CERVIX: normal appearing cervix without discharge or lesions, UTERUS: non tender  Labs: No results found for this or any previous visit (from the past 24 hour(s)). Imaging Studies:  No results found.   Assessment: There is no problem list on file for this patient.   Plan: Report given and care assumed by Nigel Bridgeman, CNM  [facility] E. 02/26/2012,4:03 PM  Nigel Bridgeman, CNM, [place] assumed care at 4:30pm. Patient reports she had appointment with [facility] next week, but this had to be rescheduled.  Wet prep  and urine showed trichomonas. Urine to culture,  Dirty urine sent for GC/Chlamydia.  Patient informed of diagnosis and plan for treatment with Flagyl 2 gm dose--per patient, simvastin and flagyl interact. I spoke with Chanetta Marshall, pharmacist at [facility] interaction noted between meds.  Reviewed this with patient. Rx'd Metronidazole 2 gm dose x 1. To schedule appointment with [facility] as follow-up as desired.  Nigel Bridgeman, CNM, [place] 02/26/12 5:20pm

## 2012-02-26 NOTE — Telephone Encounter (Signed)
Laura/cht received 

## 2012-02-26 NOTE — MAU Note (Signed)
VLatham, CNM called, msg left.

## 2012-02-26 NOTE — MAU Note (Addendum)
Last year had ablation, tubal ligation and fibroid removed.  Pain started about a month  Ago.  Went to urgent care; had work up- dx with UTI. No sex since then.  Woke up with same pain this morning, also had some bleeding, small clots.  Called [facility]

## 2012-02-26 NOTE — Telephone Encounter (Signed)
Triage/pt last seen in 2012 °

## 2012-02-26 NOTE — MAU Note (Signed)
Pt states pain is suprapubic, and inside her vagina. Began bleeding this am, noted small clots. Now blood only noted with wiping. Vaginal d/c normal.

## 2012-02-27 ENCOUNTER — Telehealth: Payer: Self-pay | Admitting: Obstetrics and Gynecology

## 2012-02-27 LAB — URINE CULTURE
Colony Count: 30000
Culture  Setup Time: 201306042314

## 2012-02-27 LAB — GC/CHLAMYDIA PROBE AMP, URINE: Chlamydia, Swab/Urine, PCR: NEGATIVE

## 2012-02-27 NOTE — Telephone Encounter (Signed)
Called pt but phone not set up to receive voicemail.

## 2012-02-27 NOTE — Telephone Encounter (Signed)
Arika/return call

## 2012-02-28 ENCOUNTER — Telehealth: Payer: Self-pay | Admitting: Obstetrics and Gynecology

## 2012-02-28 NOTE — Telephone Encounter (Signed)
Spoke with pt informing her GC/CT results were neg. Pt agrees and voices understanding.

## 2012-03-04 ENCOUNTER — Ambulatory Visit: Payer: Self-pay | Admitting: Obstetrics and Gynecology

## 2012-04-01 ENCOUNTER — Ambulatory Visit (INDEPENDENT_AMBULATORY_CARE_PROVIDER_SITE_OTHER): Payer: 59 | Admitting: Obstetrics and Gynecology

## 2012-04-01 ENCOUNTER — Encounter: Payer: Self-pay | Admitting: Obstetrics and Gynecology

## 2012-04-01 VITALS — BP 110/64 | Resp 14 | Ht 67.0 in | Wt 234.0 lb

## 2012-04-01 DIAGNOSIS — Z124 Encounter for screening for malignant neoplasm of cervix: Secondary | ICD-10-CM

## 2012-04-01 DIAGNOSIS — Z01419 Encounter for gynecological examination (general) (routine) without abnormal findings: Secondary | ICD-10-CM

## 2012-04-01 DIAGNOSIS — Z113 Encounter for screening for infections with a predominantly sexual mode of transmission: Secondary | ICD-10-CM

## 2012-04-01 DIAGNOSIS — N898 Other specified noninflammatory disorders of vagina: Secondary | ICD-10-CM

## 2012-04-01 MED ORDER — TINIDAZOLE 500 MG PO TABS: 2.0000 g | ORAL_TABLET | Freq: Every day | ORAL | Status: AC

## 2012-04-01 NOTE — Progress Notes (Signed)
Contraception ablation Last pap 12/2010 wnl Last Mammo never Last Colonoscopy never Last Dexa Scan never Primary MD Dr Loleta Chance Earvin Hansen) Abuse at Home none  Recent treatment for trich but threw medication up  Filed Vitals:   04/01/12 1628  BP: 110/64  Resp: 14   ROS: noncontributory  Physical Examination: General appearance - alert, well appearing, and in no distress Neck - supple, no significant adenopathy Chest - clear to auscultation, no wheezes, rales or rhonchi, symmetric air entry Heart - normal rate and regular rhythm Abdomen - soft, nontender, nondistended, no masses or organomegaly Breasts - breasts appear normal, no suspicious masses, no skin or nipple changes or axillary nodes Pelvic - normal external genitalia, vulva, vagina, cervix, uterus and adnexa Back exam - no CVAT Extremities - no edema, redness or tenderness in the calves or thighs  POCT WET PREP (WET MOUNT)      Component Value Range   Source Wet Prep POC vaginal     WBC, Wet Prep HPF POC       Bacteria Wet Prep HPF POC mod     BACTERIA WET PREP MORPHOLOGY POC       Clue Cells Wet Prep HPF POC Few     CLUE CELLS WET PREP WHIFF POC       Yeast Wet Prep HPF POC None     KOH Wet Prep POC       Trichomonas Wet Prep HPF POC pos     pH 5.0     A/P Wet prep - trich - Flagyl x 7days STD screen at [facility] pt request as well as pap testing - printout given for labs

## 2012-04-02 ENCOUNTER — Telehealth: Payer: Self-pay | Admitting: Obstetrics and Gynecology

## 2012-04-02 NOTE — Telephone Encounter (Signed)
Left message for pt to let her know that her lab orders have been faxed  to [facility] upon her request as she stated that she lost the orders after her visit on yesterday. Mathis Bud

## 2012-04-02 NOTE — Telephone Encounter (Signed)
Lauren [name]/pam-had appt yest/epic

## 2012-04-04 LAB — PAP IG, CT-NG, RFX HPV ASCU: PAP Smear Comment: 0

## 2012-04-06 LAB — POCT WET PREP (WET MOUNT)

## 2012-05-05 ENCOUNTER — Other Ambulatory Visit: Payer: Self-pay | Admitting: Obstetrics and Gynecology

## 2012-08-05 IMAGING — CT CT HEAD W/O CM
2 series · 16 of 30 positions shown, 20 images · non-contrast
Comparison: None.

CLINICAL DATA: Facial and mouth numbness.  History of
hypertension.  Tingling at the vertex.

CT HEAD WITHOUT CONTRAST
TECHNIQUE: Contiguous axial images were obtained from the base of
the skull through the vertex without contrast

[Series 2: head w/o · axial · non-contrast · 0.49mm/px · z∈[+32,+160]mm · 13 of 28 slices shown, 17 images]
[im 2/28  brain]
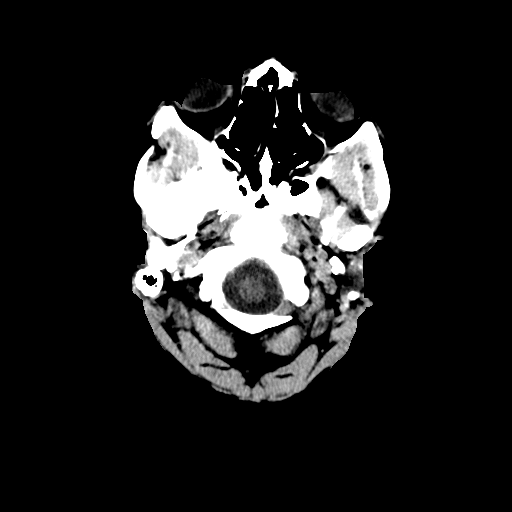
[im 2/28  bone]
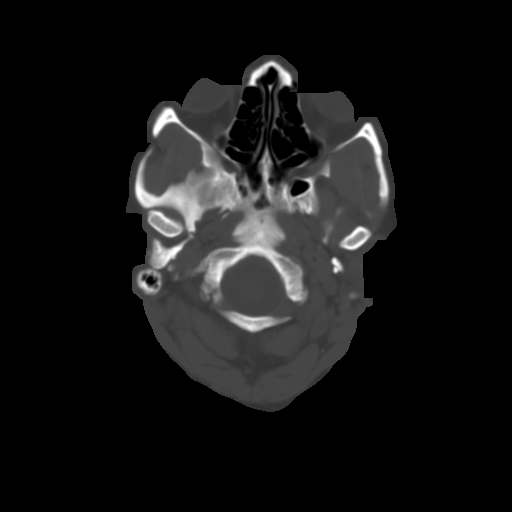
[im 4/28  brain]
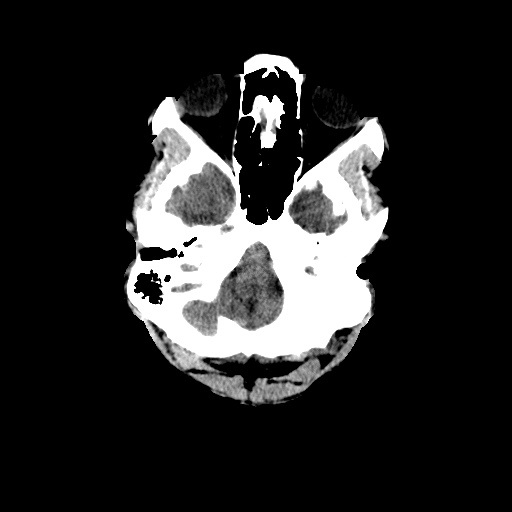
[im 6/28  brain]
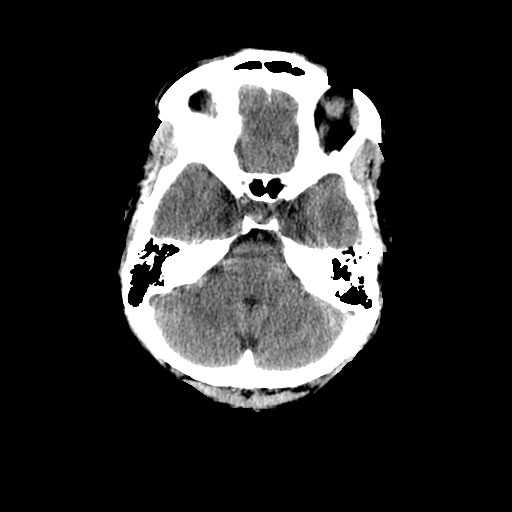
[im 8/28  brain]
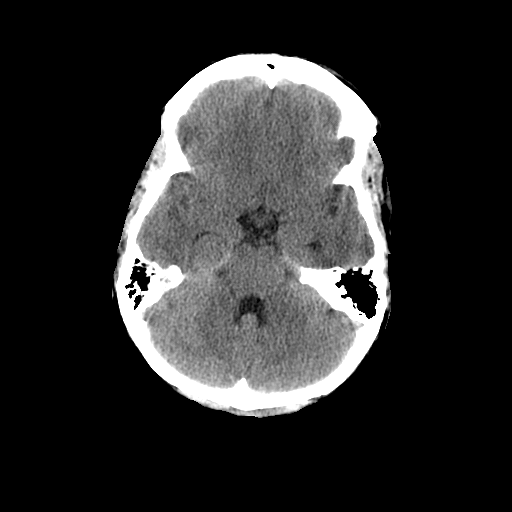
[im 10/28  brain]
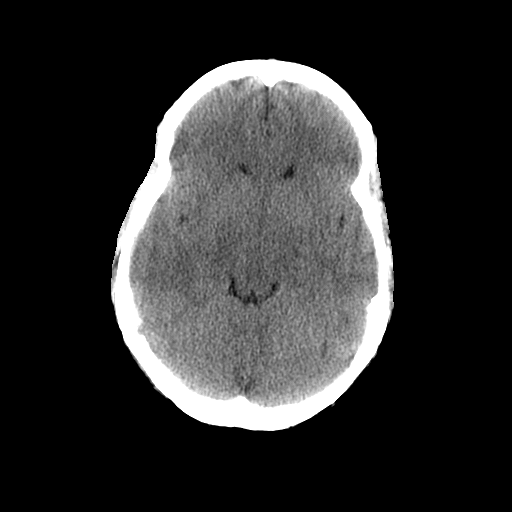
[im 10/28  bone]
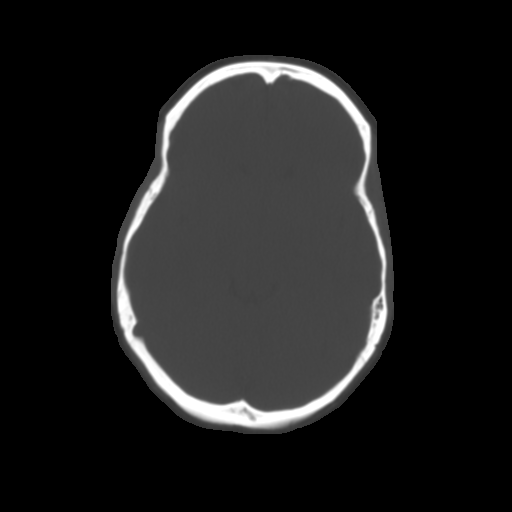
[im 12/28  brain]
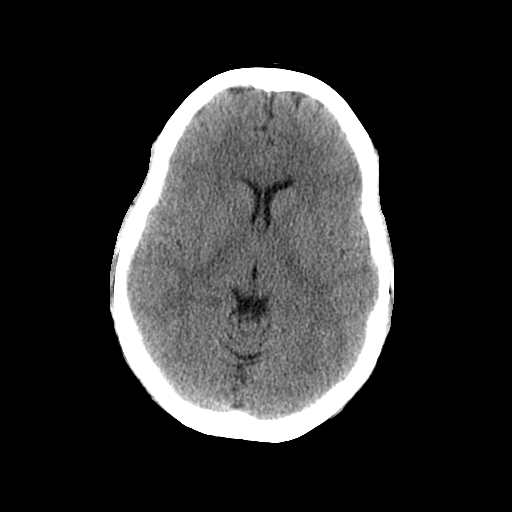
[im 14/28  brain]
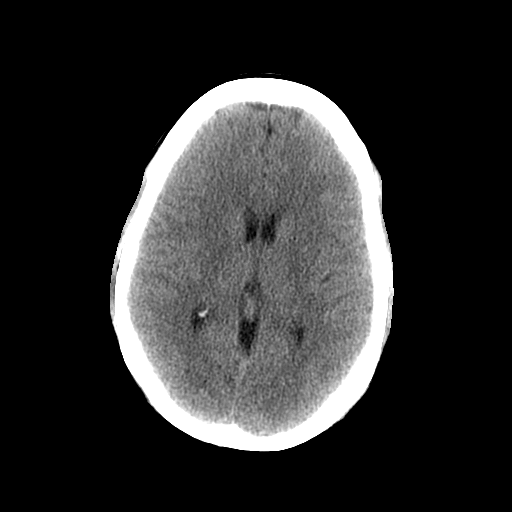
[im 16/28  brain]
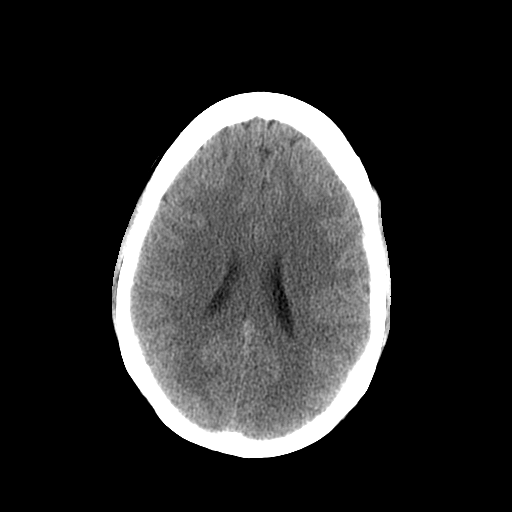
[im 18/28  brain]
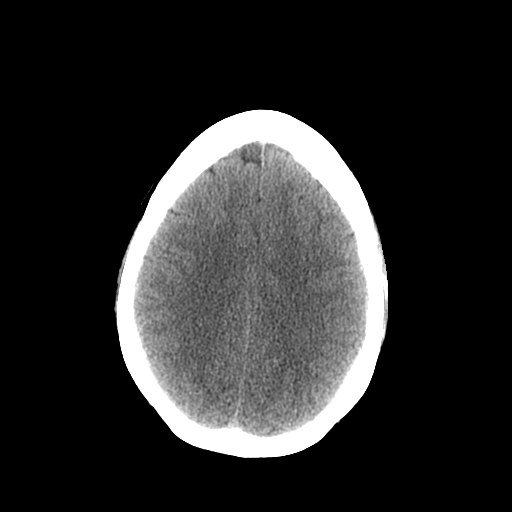
[im 18/28  bone]
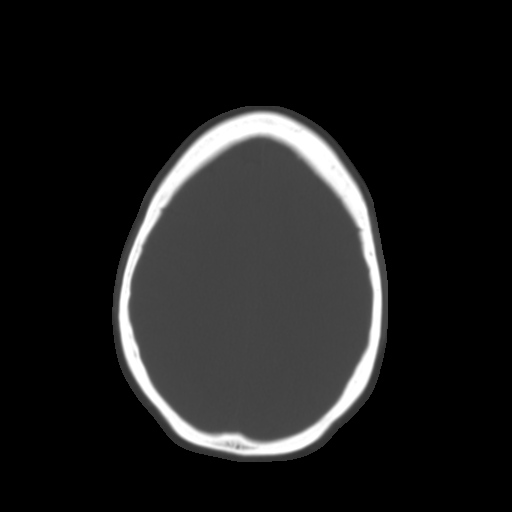
[im 20/28  brain]
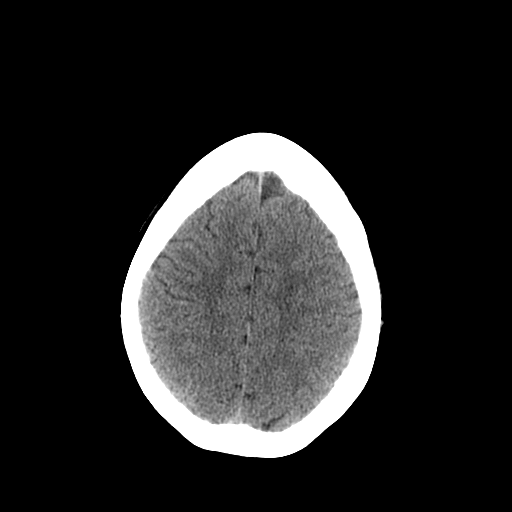
[im 22/28  brain]
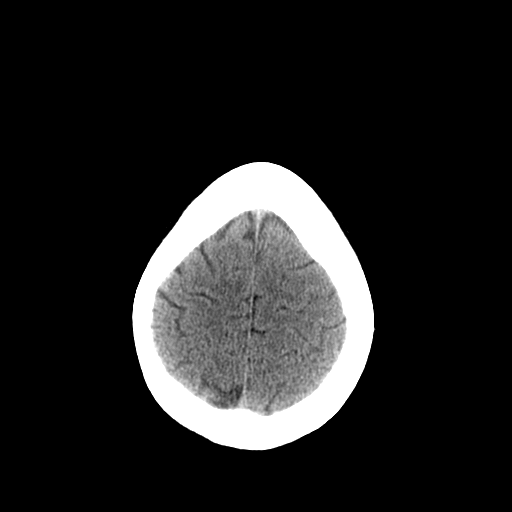
[im 24/28  brain]
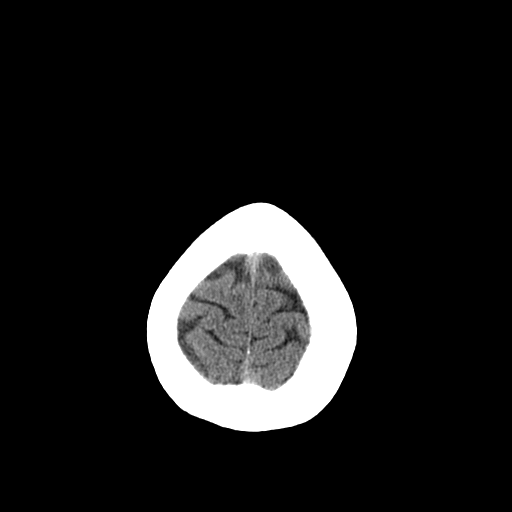
[im 26/28  brain]
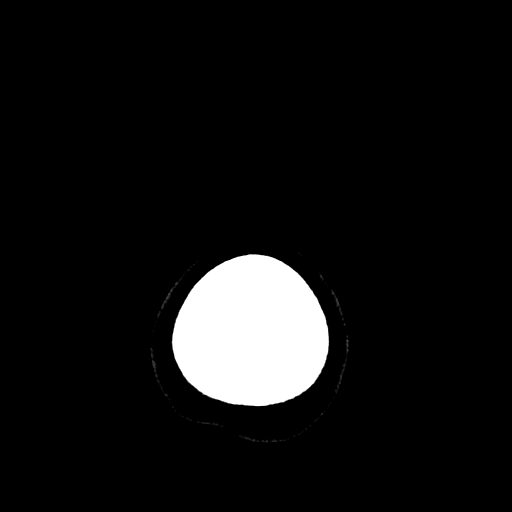
[im 26/28  bone]
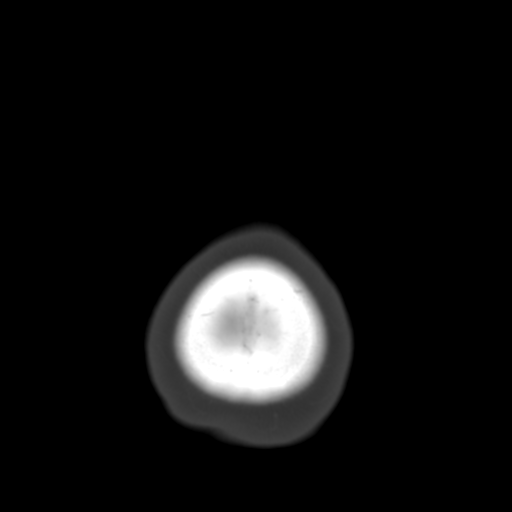

[Series 3: head bone · axial · 0.49mm/px · z∈[+32,+75]mm · 3 of 28 slices shown]
[im 2/28  bone]
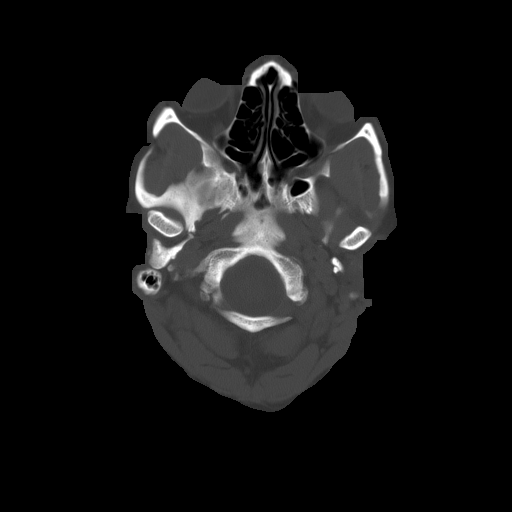
[im 6/28  bone]
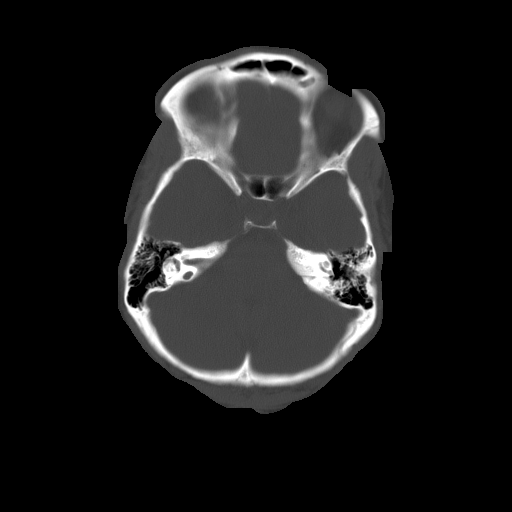
[im 10/28  bone]
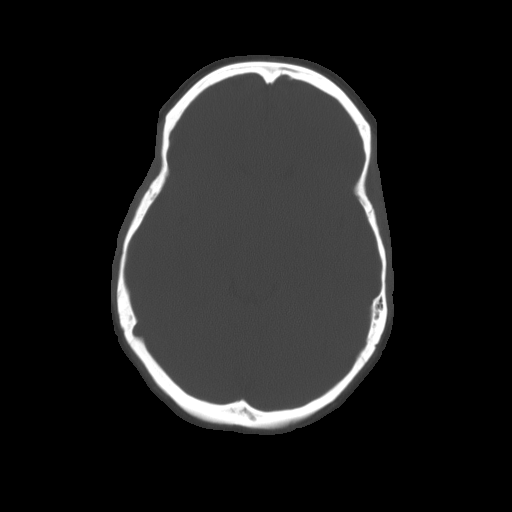

[16 of 30 positions shown; findings below may reference images not displayed]

FINDINGS: The brain has a normal appearance without evidence for
hemorrhage, acute infarction, hydrocephalus, or mass lesion.  There
is no extra axial fluid collection.  The skull and paranasal
sinuses are normal.
IMPRESSION: Normal CT of the head without contrast.

## 2012-09-15 ENCOUNTER — Telehealth: Payer: Self-pay | Admitting: Obstetrics and Gynecology

## 2012-09-15 DIAGNOSIS — Z202 Contact with and (suspected) exposure to infections with a predominantly sexual mode of transmission: Secondary | ICD-10-CM

## 2012-09-15 NOTE — Telephone Encounter (Signed)
Pt is wanting order for std testing faxed to [facility]  Order faxed.  Darien Ramus, CMA

## 2012-09-25 ENCOUNTER — Encounter: Payer: 59 | Admitting: Obstetrics and Gynecology

## 2013-03-07 ENCOUNTER — Encounter (HOSPITAL_COMMUNITY): Payer: Self-pay | Admitting: Emergency Medicine

## 2013-03-07 ENCOUNTER — Emergency Department (HOSPITAL_COMMUNITY)
Admission: EM | Admit: 2013-03-07 | Discharge: 2013-03-07 | Disposition: A | Discharge status: Home / Self Care | Payer: 59 | Source: Home / Self Care

## 2013-03-07 DIAGNOSIS — IMO0002 Reserved for concepts with insufficient information to code with codable children: Secondary | ICD-10-CM

## 2013-03-07 DIAGNOSIS — L989 Disorder of the skin and subcutaneous tissue, unspecified: Secondary | ICD-10-CM

## 2013-03-07 DIAGNOSIS — S76312A Strain of muscle, fascia and tendon of the posterior muscle group at thigh level, left thigh, initial encounter: Secondary | ICD-10-CM

## 2013-03-07 DIAGNOSIS — G8929 Other chronic pain: Secondary | ICD-10-CM

## 2013-03-07 MED ORDER — IBUPROFEN 800 MG PO TABS: 800.0000 mg | ORAL_TABLET | Freq: Three times a day (TID) | ORAL | Status: DC | PRN

## 2013-03-07 MED ORDER — ACETAMINOPHEN 500 MG PO TABS: 500.0000 mg | ORAL_TABLET | Freq: Four times a day (QID) | ORAL | Status: DC | PRN

## 2013-03-07 NOTE — ED Provider Notes (Signed)
History     CSN: [id number]  Arrival date & time 03/07/13  1657   First MD Initiated Contact with Patient 03/07/13 1727      Chief Complaint  Patient presents with  . Hip Pain    (Consider location/radiation/quality/duration/timing/severity/associated sxs/prior treatment) HPI Comments: Pt presents with two complains  First is a new mole that has been there about 2 years.  She things it is getting bigger and her frineds say it is abnormal.  No pain or bleeding.  No trauma to the area that she can remember  She also c;o right hip and low back pain for a few weeks.  Pain is better with stretching the right let in front of her.  aggrivated with walking or sitting long time periods.  No radiation, n or t.  No weaknesses. 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    Patient is a 37 y.o. female presenting with hip pain.  Hip Pain    Past Medical History  Diagnosis Date  . Hypertension     checks bp at home - 110/70  . Pregnancy induced hypertension   . Pre-eclampsia, severe     delivered stillbirth  . Protein S deficiency     Took lovenox with twin pregnancy  . Preterm labor   . Hypercholesteremia   . Bacterial vaginosis   . Vitamin D deficiency 2012  . Trichomonas vaginitis 2012  . Endometrial polyp 2012  . Menorrhagia 2012  . Intermenstrual bleeding 2008  . Fibroids 2008    Past Surgical History  Procedure Laterality Date  . Cesarean section  2005, 2003  . Ankle surgery  2000, 1999  . Novasure ablation  04/09/2011    Procedure: NOVASURE ABLATION;  Surgeon: Esmeralda Arthur, MD;  Location: [facility];  Service: Gynecology;  Laterality: N/A;  . Laparoscopic tubal ligation  04/09/2011    Procedure: LAPAROSCOPIC TUBAL LIGATION;  Surgeon: Esmeralda Arthur, MD;  Location: [facility];  Service: Gynecology;  Laterality: Bilateral;  . Wisdom tooth extraction      Family History  Problem Relation Age of Onset  . Anesthesia problems Neg Hx   . Hypotension Neg Hx   . Malignant hyperthermia  Neg Hx   . Pseudochol deficiency Neg Hx     History  Substance Use Topics  . Smoking status: Never Smoker   . Smokeless tobacco: Never Used  . Alcohol Use: 0.6 oz/week    1 Glasses of wine per week    OB History   Grav Para Term Preterm Abortions TAB SAB Ect Mult Living   3 2  2 1 1   1 2       Review of Systems  All other systems reviewed and are negative.    Allergies  Review of patient's allergies indicates no known allergies.  Home Medications   Current Outpatient Rx  Name  Route  Sig  Dispense  Refill  . acetaminophen (TYLENOL) 500 MG tablet   Oral   Take 1 tablet (500 mg total) by mouth every 6 (six) hours as needed for pain.   30 tablet   0   . aspirin 325 MG tablet   Oral   Take 325 mg by mouth daily.         Marland Kitchen ibuprofen (ADVIL,MOTRIN) 800 MG tablet   Oral   Take 1 tablet (800 mg total) by mouth every 8 (eight) hours as needed for pain.   30 tablet   0   . lisinopril-hydrochlorothiazide (PRINZIDE,ZESTORETIC) 20-12.5 MG  per tablet   Oral   Take 1 tablet by mouth daily.           . metroNIDAZOLE (FLAGYL) 500 MG tablet      TAKE ONE TABLET BY MOUTH TWICE DAILY FOR 7 DAYS   14 tablet   0   . Omega-3 Fatty Acids (FISH OIL) 1000 MG CAPS   Oral   Take 1 capsule by mouth daily.          . simvastatin (ZOCOR) 10 MG tablet   Oral   Take 10 mg by mouth at bedtime.           There were no vitals taken for this visit.  Physical Exam  Nursing note and vitals reviewed. Constitutional: She appears well-developed and well-nourished. No distress.  Morbidly obese  HENT:  Head: Normocephalic and atraumatic.  Eyes: Conjunctivae are normal. Pupils are equal, round, and reactive to light.  Neck: Normal range of motion. Neck supple.  Cardiovascular: Normal rate, regular rhythm, normal heart sounds and intact distal pulses.   Pulmonary/Chest: Effort normal.  Musculoskeletal: Normal range of motion.  Gait wnl, ttp hamstring insertion on right.  No  other ttp, or abnormalities  Neurological: She is alert. She has normal reflexes. She displays normal reflexes. No cranial nerve deficit. She exhibits normal muscle tone. Coordination normal.  Skin: Skin is warm and dry. She is not diaphoretic.  Right lower back with 3 cm diameter lesion that is dark,irregular border.    ED Course  Procedures (including critical care time)  Labs Reviewed - No data to display No results found.   1. Hamstring strain, left, initial encounter   2. Hip pain, chronic, right   3. Skin lesion of back       MDM  When I discused the need to stretch the hamstring and do back exercises she then stated she has done these things with no relief. She wanted an xray and pain meds.  I explaibned an xray is best for bones and she has had no trauma to suggest fraacture.  Her hx and findings suggest hamstring tigntness and core weakness that can improve with rehab so exercises given.  She can f/u with ortho if needed.  Numbers given.  I do think she should see her dermatologist for further eval of the mole.  She will make her own apt for this.        Lauren Rowan, MD 03/07/13 [phone]

## 2013-03-07 NOTE — ED Notes (Addendum)
Pt c/o right hip pain onset 1 week... Pain is "constant and stabbing" and increases w/movement... Denies: tingly numbness, inj/trauma to site... Believes it might be from sleeping in an old mattress...   Also c/o a dark patch on right side of hip x1 year... She is alert and oriented w/no signs of acute distress.

## 2013-05-18 ENCOUNTER — Encounter: Payer: Self-pay | Admitting: Obstetrics and Gynecology

## 2013-06-24 ENCOUNTER — Other Ambulatory Visit: Payer: Self-pay | Admitting: Family Medicine

## 2013-06-24 DIAGNOSIS — R1032 Left lower quadrant pain: Secondary | ICD-10-CM

## 2013-06-25 ENCOUNTER — Other Ambulatory Visit: Payer: 59

## 2013-06-26 ENCOUNTER — Ambulatory Visit
Admission: RE | Admit: 2013-06-26 | Discharge: 2013-06-26 | Disposition: A | Discharge status: Home / Self Care | Payer: 59 | Source: Ambulatory Visit | Attending: Family Medicine | Admitting: Family Medicine

## 2013-06-26 DIAGNOSIS — R1032 Left lower quadrant pain: Secondary | ICD-10-CM

## 2013-06-26 MED ORDER — IOHEXOL 300 MG/ML  SOLN
100.0000 mL | Freq: Once | INTRAMUSCULAR | Status: AC | PRN
  Administered 2013-06-26: 100 mL via INTRAVENOUS

## 2014-07-26 ENCOUNTER — Encounter: Payer: Self-pay | Admitting: Obstetrics and Gynecology

## 2014-08-25 ENCOUNTER — Other Ambulatory Visit: Payer: Self-pay | Admitting: Obstetrics and Gynecology

## 2014-09-10 ENCOUNTER — Other Ambulatory Visit (HOSPITAL_COMMUNITY): Payer: Self-pay | Admitting: Obstetrics and Gynecology

## 2014-09-10 NOTE — H&P (Signed)
Lauren [name] is a 38 y.o.  female P 2-1-0-2 who is S/P Novasure Ablation, presents for hysterectomy because of irregular vaginal bleeding.  In July 2012 the patient had a Novasure Ablation for menorrhagia and was amenorrheic until September 2015 when she began bleeding  irregularly with the need to change her pad every 3 hours.  Fortunately she didn't have any cramping but due to a history of Protein S Deficiency she was not a candidate for hormonal management. She Admits to post coital bleeding but denies any changes in bowel or bladder function and has not had any vaginitis symptoms. A sono-hysterogram was attempted in October 2015 however, due to a fibroid the pipelle could not be advanced into the uterine cavity for saline instillation.  The scan otherwise showed a retroverted uterus measuring 7.80 x 7.81 x 4.66 cm with large fibroids filling the uterus:   Intramural:  anteriorly-3.1 x 2.3 cm and 2.3 x 1.9 cm,   fundal-1.5 x 0.8 cm and 1.6 x 1.4 cm, 9 x 5 mm and 7 mm x 8 mm;   Sub-serosal: posteriorly 5.6 x 3.6 cm;  endometrium obscured by fibroids;   left ovary-2.57 x 1.13 x 1.20 cm and right ovary-2.57 x 0.619 x 1.24 cm.   An endometrial biopsy revealed scant superficial benign endometrial fragments without atypia or malignancy.  Gonorrhea and chlamydia cultures in August were negative. A review of both medical and surgical management options were given to the patient regarding her symptoms however, she desires definitive therapy in the form of hysterectomy.   Past Medical History  OB History: G: 2;  P: 2-1-0-2;  C-section: 2002 with a fetal demise at 30 weeks and 2005 with twins  GYN History: menarche: 38 YO    LMP: Bled x 1 hour 09/03/2014    Contracepton bilateral tubal ligation  The patient reports a past history of: genital warts.  Denies history of abnormal PAP smear  Last PAP smear:04/2014  Medical History: Protein S Deficiency, Hypertension, Hypercholesterolemia, Vitamin D Deficiency,  Placental DVT, Obesity and Uterine Fibroids  Surgical History: 2012 Hysteroscopy, D&C with Resection of Endometrial Polyp and Novasure Endometrial Ablation;   2012  Tubal Sterilization and 1999 and 2000  Left Ankle Surgery with Internal Fixation Hardware Placed Denies problems with anesthesia or history of blood transfusions  Family History: Cancer (brain, ovarian, breast and colon), Hypercholesterolemia, Renal Disease, Diabetes Mellitus and  Hypertension  Social History: Divorced and employed with [employer];  Denies tobacco or alcohol use   Medications  Belviq 10 mg bid Alprazolam 0.5 mg prn Lisinopril/HCTZ   20/12.5 mg daily Simvastatin 10 mg daily  No Known Allergies   Denies sensitivity to peanuts, shellfish, soy, latex or adhesives.   ROS: Admits to glasses but denies headache, vision changes, nasal congestion, dysphagia, tinnitus, dizziness, hoarseness, cough,  chest pain, shortness of breath, nausea, vomiting, diarrhea,constipation,  urinary frequency, urgency  dysuria, hematuria, vaginitis symptoms, pelvic pain, swelling of joints,easy bruising,  myalgias, arthralgias, skin rashes, unexplained weight loss and except as is mentioned in the history of present illness, patient's review of systems is otherwise negative.   Physical Exam  Bp: 130/70   P: 72  R: 18   Temperature: 98.1 degrees F orally  Weight: 213 lbs.  Height: 5\' 7"   BMI: 33.4  Neck: supple without masses or thyromegaly Lungs: clear to auscultation Heart: regular rate and rhythm Abdomen: soft, non-tender and no organomegaly Pelvic:EGBUS- wnl; vagina-normal rugae, scant blood in vault; uterus-irregular and 10-12 weeks size,  cervix without lesions or motion tenderness; adnexae-no tenderness or masses Extremities:  no clubbing, cyanosis or edema   Assesment: Irregular Bleeding           Uterine Fibroids                      S/P Uterine Ablation   Disposition: Reviewed with the patient the indications for her  procedures along with the risks and benefits:    Benefits of the robotic approach include lesser postoperative pain, less blood loss during surgery, reduced risk of injury to other organs due to better visualization with a 3-D HD 10 times magnifying camera, shorter hospital stay between 0-1 night and rapid recovery with return to daily routine in 2-3 weeks. Although robotically-assisted hysterectomy has a longer operative time than traditional laparotomy, in a patient with good medical history, the benefits usually outweigh the risks.  Risks include bleeding, infection, injury to other organs, need for laparotomy, transient post-operative facial edema, increased risk of pelvic prolapse associated with any hysterectomy as well as earlier onset of menopause. Preservation or preventative removal of the ovaries was also reviewed and left to the patient's discretion. Finally, the option of supracervical hysterectomy was also discussed with the possible but yet unconfirmed benefits of reduction of pelvic prolapse. If supracervical hysterectomy is performed, Pap smear screening would continue as currently recommended, monthly bleeding is possible despite cauterization of cervical canal and a small but possible risk of cervical fibroid development is present.  Patient informed about FDA warning on use of morcellator dated 01/08/2013.   Discussed:  1. Incidence of post-operative diagnosis of sarcoma in women undergoing a hysterectomy is 2:1000 2. Annual incidence of leiomyosarcoma is 0.64/100,000 women 3. Sarcomas have the highest incidence in women over 65 4. Power morcellation involves risks of spreading tissue / disease. In he case of undiagnosed cancer, it may adversely affect the patient's prognosis.  A Miralax Bowel Prep was given to the patient to be completed the day before the surgery.   The patient verbalized understanding of these risks and has consented to proceed with a Robot Assisted Total  Laparoscopic Hysterectomy with Bilateral Salpingectomy and Possible Uterine Morecellation at [facility] on September 24, 2014  at 11:15 a.m.   CSN# [id number]   Mohammed Mcandrew J. Florene Glen, PA-C  for Dr. Dede Query. Rivard

## 2014-09-16 ENCOUNTER — Other Ambulatory Visit: Payer: Self-pay

## 2014-09-16 ENCOUNTER — Inpatient Hospital Stay (HOSPITAL_COMMUNITY): Admission: RE | Admit: 2014-09-16 | Payer: 59 | Source: Ambulatory Visit

## 2014-09-16 ENCOUNTER — Encounter (HOSPITAL_COMMUNITY)
Admission: RE | Admit: 2014-09-16 | Discharge: 2014-09-16 | Disposition: A | Discharge status: Home / Self Care | Payer: 59 | Source: Ambulatory Visit | Attending: Obstetrics and Gynecology | Admitting: Obstetrics and Gynecology

## 2014-09-16 ENCOUNTER — Encounter (HOSPITAL_COMMUNITY): Payer: Self-pay

## 2014-09-16 DIAGNOSIS — Z01818 Encounter for other preprocedural examination: Secondary | ICD-10-CM | POA: Insufficient documentation

## 2014-09-16 DIAGNOSIS — I10 Essential (primary) hypertension: Secondary | ICD-10-CM | POA: Diagnosis not present

## 2014-09-16 HISTORY — DX: Disease of blood and blood-forming organs, unspecified: D75.9

## 2014-09-16 LAB — BASIC METABOLIC PANEL
Anion gap: 7 (ref 5–15)
BUN: 19 mg/dL (ref 6–23)
CO2: 27 mmol/L (ref 19–32)
Calcium: 9.1 mg/dL (ref 8.4–10.5)
Chloride: 105 mEq/L (ref 96–112)
Creatinine, Ser: 0.66 mg/dL (ref 0.50–1.10)
GFR calc Af Amer: >90 mL/min (ref >90)
GFR calc non Af Amer: >90 mL/min (ref >90)
GLUCOSE: 89 mg/dL (ref 70–99)
POTASSIUM: 3.9 mmol/L (ref 3.5–5.1)
SODIUM: 139 mmol/L (ref 135–145)

## 2014-09-16 LAB — CBC
HCT: 36.6 % (ref 36.0–46.0)
HEMOGLOBIN: 12 g/dL (ref 12.0–15.0)
MCH: 28.7 pg (ref 26.0–34.0)
MCHC: 32.8 g/dL (ref 30.0–36.0)
MCV: 87.6 fL (ref 78.0–100.0)
Platelets: 266 10*3/uL (ref 150–400)
RBC: 4.18 MIL/uL (ref 3.87–5.11)
RDW: 13.8 % (ref 11.5–15.5)
WBC: 7.9 10*3/uL (ref 4.0–10.5)

## 2014-09-16 NOTE — Pre-Procedure Instructions (Signed)
No orders from surgeon- office closed.

## 2014-09-16 NOTE — Patient Instructions (Signed)
Your procedure is scheduled on: 09/29/14  Enter through the Main Entrance at :0945 am Pick up desk phone and dial [phone] and inform us of your arrival.  Please call [phone] if you have any problems the morning of surgery.  Remember: Do not eat food or drink liquids, including water, after midnight: Tuesday   You may brush your teeth the morning of surgery.  Take these meds the morning of surgery with a sip of water:Lisinopril- HCTZ  DO NOT wear jewelry, eye make-up, lipstick,body lotion, or dark fingernail polish.  (Polished toes are ok) You may wear deodorant.  If you are to be admitted after surgery, leave suitcase in car until your room has been assigned. Patients discharged on the day of surgery will not be allowed to drive home. Wear loose fitting, comfortable clothes for your ride home.

## 2014-09-21 ENCOUNTER — Other Ambulatory Visit: Payer: Self-pay | Admitting: Obstetrics and Gynecology

## 2014-09-28 MED ORDER — DEXTROSE 5 % IV SOLN
2.0000 g | INTRAVENOUS | Status: AC
  Administered 2014-09-29: 2 g via INTRAVENOUS
  Filled 2014-09-28: qty 2

## 2014-09-29 ENCOUNTER — Ambulatory Visit (HOSPITAL_COMMUNITY)
Admission: RE | Admit: 2014-09-29 | Discharge: 2014-09-29 | Disposition: A | Discharge status: Home / Self Care | Payer: 59 | Source: Ambulatory Visit | Attending: Obstetrics and Gynecology | Admitting: Obstetrics and Gynecology

## 2014-09-29 ENCOUNTER — Encounter (HOSPITAL_COMMUNITY)
Admission: RE | Disposition: A | Discharge status: Home / Self Care | Payer: Self-pay | Source: Ambulatory Visit | Attending: Obstetrics and Gynecology

## 2014-09-29 ENCOUNTER — Ambulatory Visit (HOSPITAL_COMMUNITY): Payer: 59 | Admitting: Anesthesiology

## 2014-09-29 ENCOUNTER — Encounter (HOSPITAL_COMMUNITY): Payer: Self-pay | Admitting: *Deleted

## 2014-09-29 DIAGNOSIS — Z6833 Body mass index (BMI) 33.0-33.9, adult: Secondary | ICD-10-CM | POA: Diagnosis not present

## 2014-09-29 DIAGNOSIS — N946 Dysmenorrhea, unspecified: Secondary | ICD-10-CM | POA: Diagnosis not present

## 2014-09-29 DIAGNOSIS — Z803 Family history of malignant neoplasm of breast: Secondary | ICD-10-CM | POA: Diagnosis not present

## 2014-09-29 DIAGNOSIS — E669 Obesity, unspecified: Secondary | ICD-10-CM | POA: Diagnosis not present

## 2014-09-29 DIAGNOSIS — N938 Other specified abnormal uterine and vaginal bleeding: Secondary | ICD-10-CM | POA: Diagnosis not present

## 2014-09-29 DIAGNOSIS — Z9851 Tubal ligation status: Secondary | ICD-10-CM | POA: Diagnosis not present

## 2014-09-29 DIAGNOSIS — D251 Intramural leiomyoma of uterus: Secondary | ICD-10-CM | POA: Diagnosis not present

## 2014-09-29 DIAGNOSIS — Z8041 Family history of malignant neoplasm of ovary: Secondary | ICD-10-CM | POA: Insufficient documentation

## 2014-09-29 DIAGNOSIS — Z800 Family history of malignant neoplasm of digestive organs: Secondary | ICD-10-CM | POA: Insufficient documentation

## 2014-09-29 DIAGNOSIS — D6859 Other primary thrombophilia: Secondary | ICD-10-CM | POA: Insufficient documentation

## 2014-09-29 DIAGNOSIS — E780 Pure hypercholesterolemia: Secondary | ICD-10-CM | POA: Diagnosis not present

## 2014-09-29 DIAGNOSIS — Z808 Family history of malignant neoplasm of other organs or systems: Secondary | ICD-10-CM | POA: Diagnosis not present

## 2014-09-29 DIAGNOSIS — N926 Irregular menstruation, unspecified: Secondary | ICD-10-CM | POA: Diagnosis present

## 2014-09-29 DIAGNOSIS — I10 Essential (primary) hypertension: Secondary | ICD-10-CM | POA: Insufficient documentation

## 2014-09-29 HISTORY — PX: ROBOTIC ASSISTED TOTAL HYSTERECTOMY: SHX6085

## 2014-09-29 HISTORY — PX: BILATERAL SALPINGECTOMY: SHX5743

## 2014-09-29 LAB — PREGNANCY, URINE: PREG TEST UR: NEGATIVE

## 2014-09-29 SURGERY — ROBOTIC ASSISTED TOTAL HYSTERECTOMY
Anesthesia: General | Site: Abdomen

## 2014-09-29 MED ORDER — ROCURONIUM BROMIDE 100 MG/10ML IV SOLN
INTRAVENOUS | Status: AC
  Filled 2014-09-29: qty 1

## 2014-09-29 MED ORDER — ROPIVACAINE HCL 5 MG/ML IJ SOLN
INTRAMUSCULAR | Status: AC
  Filled 2014-09-29: qty 60

## 2014-09-29 MED ORDER — ACETAMINOPHEN 160 MG/5ML PO SOLN: 325.0000 mg | ORAL | Status: DC | PRN

## 2014-09-29 MED ORDER — SODIUM CHLORIDE 0.9 % IJ SOLN
INTRAMUSCULAR | Status: AC
  Filled 2014-09-29: qty 10

## 2014-09-29 MED ORDER — GLYCOPYRROLATE 0.2 MG/ML IJ SOLN
INTRAMUSCULAR | Status: DC | PRN
Start: 2014-09-29 — End: 2014-09-29
  Administered 2014-09-29: 0.6 mg via INTRAVENOUS
  Administered 2014-09-29: 0.2 mg via INTRAVENOUS

## 2014-09-29 MED ORDER — LIDOCAINE HCL (CARDIAC) 20 MG/ML IV SOLN
INTRAVENOUS | Status: AC
  Filled 2014-09-29: qty 5

## 2014-09-29 MED ORDER — KETOROLAC TROMETHAMINE 30 MG/ML IJ SOLN
INTRAMUSCULAR | Status: DC | PRN
  Administered 2014-09-29: 30 mg via INTRAVENOUS
  Administered 2014-09-29: 30 mg via INTRAMUSCULAR

## 2014-09-29 MED ORDER — ROCURONIUM BROMIDE 100 MG/10ML IV SOLN
INTRAVENOUS | Status: DC | PRN
  Administered 2014-09-29: 10 mg via INTRAVENOUS
  Administered 2014-09-29: 5 mg via INTRAVENOUS
  Administered 2014-09-29: 50 mg via INTRAVENOUS
  Administered 2014-09-29: 5 mg via INTRAVENOUS

## 2014-09-29 MED ORDER — MIDAZOLAM HCL 2 MG/2ML IJ SOLN: 0.5000 mg | Freq: Once | INTRAMUSCULAR | Status: DC | PRN

## 2014-09-29 MED ORDER — ROPIVACAINE HCL 5 MG/ML IJ SOLN
INTRAMUSCULAR | Status: DC | PRN
  Administered 2014-09-29: 100 mL

## 2014-09-29 MED ORDER — PROMETHAZINE HCL 25 MG/ML IJ SOLN: 6.2500 mg | INTRAMUSCULAR | Status: DC | PRN

## 2014-09-29 MED ORDER — HYDROMORPHONE HCL 1 MG/ML IJ SOLN
0.2500 mg | INTRAMUSCULAR | Status: DC | PRN
  Administered 2014-09-29 (×3): 0.5 mg via INTRAVENOUS

## 2014-09-29 MED ORDER — IBUPROFEN 600 MG PO TABS: ORAL_TABLET | ORAL | Status: DC

## 2014-09-29 MED ORDER — DEXAMETHASONE SODIUM PHOSPHATE 10 MG/ML IJ SOLN
INTRAMUSCULAR | Status: AC
  Filled 2014-09-29: qty 1

## 2014-09-29 MED ORDER — DEXAMETHASONE SODIUM PHOSPHATE 4 MG/ML IJ SOLN
INTRAMUSCULAR | Status: AC
  Filled 2014-09-29: qty 1

## 2014-09-29 MED ORDER — MEPERIDINE HCL 25 MG/ML IJ SOLN: 6.2500 mg | INTRAMUSCULAR | Status: DC | PRN

## 2014-09-29 MED ORDER — ONDANSETRON HCL 4 MG PO TABS: 4.0000 mg | ORAL_TABLET | Freq: Three times a day (TID) | ORAL | Status: DC | PRN

## 2014-09-29 MED ORDER — KETOROLAC TROMETHAMINE 30 MG/ML IJ SOLN
INTRAMUSCULAR | Status: AC
  Filled 2014-09-29: qty 1

## 2014-09-29 MED ORDER — IBUPROFEN 600 MG PO TABS: 600.0000 mg | ORAL_TABLET | Freq: Four times a day (QID) | ORAL | Status: DC | PRN

## 2014-09-29 MED ORDER — OXYCODONE-ACETAMINOPHEN 5-325 MG PO TABS
1.0000 | ORAL_TABLET | ORAL | Status: DC | PRN
  Administered 2014-09-29: 2 via ORAL
  Filled 2014-09-29: qty 2

## 2014-09-29 MED ORDER — FENTANYL CITRATE 0.05 MG/ML IJ SOLN
INTRAMUSCULAR | Status: AC
  Filled 2014-09-29: qty 5

## 2014-09-29 MED ORDER — OXYCODONE-ACETAMINOPHEN 5-325 MG PO TABS: 1.0000 | ORAL_TABLET | Freq: Four times a day (QID) | ORAL | Status: DC | PRN

## 2014-09-29 MED ORDER — LIDOCAINE HCL (CARDIAC) 20 MG/ML IV SOLN
INTRAVENOUS | Status: DC | PRN
  Administered 2014-09-29: 50 mg via INTRAVENOUS

## 2014-09-29 MED ORDER — NEOSTIGMINE METHYLSULFATE 10 MG/10ML IV SOLN
INTRAVENOUS | Status: DC | PRN
  Administered 2014-09-29: 4 mg via INTRAVENOUS

## 2014-09-29 MED ORDER — LACTATED RINGERS IV SOLN
INTRAVENOUS | Status: DC
Start: 2014-09-29 — End: 2014-09-29
  Administered 2014-09-29 (×3): via INTRAVENOUS

## 2014-09-29 MED ORDER — PROPOFOL 10 MG/ML IV BOLUS
INTRAVENOUS | Status: DC | PRN
  Administered 2014-09-29: 170 mg via INTRAVENOUS

## 2014-09-29 MED ORDER — ACETAMINOPHEN 325 MG PO TABS: 325.0000 mg | ORAL_TABLET | ORAL | Status: DC | PRN

## 2014-09-29 MED ORDER — ONDANSETRON HCL 4 MG/2ML IJ SOLN
INTRAMUSCULAR | Status: AC
  Filled 2014-09-29: qty 2

## 2014-09-29 MED ORDER — ONDANSETRON HCL 4 MG/2ML IJ SOLN
INTRAMUSCULAR | Status: DC | PRN
  Administered 2014-09-29: 4 mg via INTRAVENOUS

## 2014-09-29 MED ORDER — MENTHOL 3 MG MT LOZG: 1.0000 | LOZENGE | OROMUCOSAL | Status: DC | PRN

## 2014-09-29 MED ORDER — SCOPOLAMINE 1 MG/3DAYS TD PT72
1.0000 | MEDICATED_PATCH | Freq: Once | TRANSDERMAL | Status: DC
  Administered 2014-09-29: 1.5 mg via TRANSDERMAL

## 2014-09-29 MED ORDER — STERILE WATER FOR IRRIGATION IR SOLN
Status: DC | PRN
  Administered 2014-09-29: 1000 mL via INTRAVESICAL

## 2014-09-29 MED ORDER — HYDROMORPHONE HCL 1 MG/ML IJ SOLN
INTRAMUSCULAR | Status: AC
  Administered 2014-09-29: 0.5 mg via INTRAVENOUS
  Filled 2014-09-29: qty 1

## 2014-09-29 MED ORDER — LACTATED RINGERS IR SOLN
Status: DC | PRN
  Administered 2014-09-29: 3000 mL

## 2014-09-29 MED ORDER — GLYCOPYRROLATE 0.2 MG/ML IJ SOLN
INTRAMUSCULAR | Status: AC
  Filled 2014-09-29: qty 1

## 2014-09-29 MED ORDER — NEOSTIGMINE METHYLSULFATE 10 MG/10ML IV SOLN
INTRAVENOUS | Status: AC
  Filled 2014-09-29: qty 1

## 2014-09-29 MED ORDER — KETOROLAC TROMETHAMINE 30 MG/ML IJ SOLN: 30.0000 mg | Freq: Once | INTRAMUSCULAR | Status: DC | PRN

## 2014-09-29 MED ORDER — SODIUM CHLORIDE 0.9 % IJ SOLN
INTRAMUSCULAR | Status: AC
  Filled 2014-09-29: qty 50

## 2014-09-29 MED ORDER — MIDAZOLAM HCL 2 MG/2ML IJ SOLN
INTRAMUSCULAR | Status: AC
  Filled 2014-09-29: qty 2

## 2014-09-29 MED ORDER — MIDAZOLAM HCL 2 MG/2ML IJ SOLN
INTRAMUSCULAR | Status: DC | PRN
  Administered 2014-09-29: 2 mg via INTRAVENOUS

## 2014-09-29 MED ORDER — GLYCOPYRROLATE 0.2 MG/ML IJ SOLN
INTRAMUSCULAR | Status: AC
  Filled 2014-09-29: qty 4

## 2014-09-29 MED ORDER — FENTANYL CITRATE 0.05 MG/ML IJ SOLN
INTRAMUSCULAR | Status: DC | PRN
  Administered 2014-09-29: 50 ug via INTRAVENOUS
  Administered 2014-09-29: 100 ug via INTRAVENOUS
  Administered 2014-09-29: 50 ug via INTRAVENOUS
  Administered 2014-09-29: 100 ug via INTRAVENOUS
  Administered 2014-09-29 (×2): 25 ug via INTRAVENOUS

## 2014-09-29 MED ORDER — PROPOFOL 10 MG/ML IV BOLUS
INTRAVENOUS | Status: AC
  Filled 2014-09-29: qty 20

## 2014-09-29 MED ORDER — LACTATED RINGERS IV SOLN: INTRAVENOUS | Status: DC

## 2014-09-29 MED ORDER — SCOPOLAMINE 1 MG/3DAYS TD PT72
MEDICATED_PATCH | TRANSDERMAL | Status: DC
Start: 2014-09-29 — End: 2014-09-30
  Administered 2014-09-29: 11:00:00 1.5 mg via TRANSDERMAL
  Filled 2014-09-29: qty 1

## 2014-09-29 MED ORDER — DEXAMETHASONE SODIUM PHOSPHATE 10 MG/ML IJ SOLN
INTRAMUSCULAR | Status: DC | PRN
  Administered 2014-09-29: 5 mg via INTRAVENOUS

## 2014-09-29 SURGICAL SUPPLY — 61 items
BARRIER ADHS 3X4 INTERCEED (GAUZE/BANDAGES/DRESSINGS) ×4 IMPLANT
BENZOIN TINCTURE PRP APPL 2/3 (GAUZE/BANDAGES/DRESSINGS) ×4 IMPLANT
CATH FOLEY 3WAY  5CC 16FR (CATHETERS) ×4
CATH FOLEY 3WAY  5CC 18FR (CATHETERS) ×2
CATH FOLEY 3WAY 5CC 16FR (CATHETERS) ×4 IMPLANT
CATH FOLEY 3WAY 5CC 18FR (CATHETERS) ×2 IMPLANT
CLOSURE WOUND 1/2 X4 (GAUZE/BANDAGES/DRESSINGS) ×1
CLOTH BEACON ORANGE TIMEOUT ST (SAFETY) ×4 IMPLANT
CONT PATH 16OZ SNAP LID 3702 (MISCELLANEOUS) ×4 IMPLANT
CORD ACTIVE DISPOSABLE (ELECTRODE)
CORD BIPOLAR FORCEPS 12FT (ELECTRODE) IMPLANT
CORD ELECTRO ACTIVE DISP (ELECTRODE) IMPLANT
COVER BACK TABLE 60X90IN (DRAPES) ×8 IMPLANT
COVER TIP SHEARS 8 DVNC (MISCELLANEOUS) ×2 IMPLANT
COVER TIP SHEARS 8MM DA VINCI (MISCELLANEOUS) ×2
DECANTER SPIKE VIAL GLASS SM (MISCELLANEOUS) ×4 IMPLANT
DRAPE WARM FLUID 44X44 (DRAPE) ×4 IMPLANT
DURAPREP 26ML APPLICATOR (WOUND CARE) ×4 IMPLANT
ELECT REM PT RETURN 9FT ADLT (ELECTROSURGICAL) ×4
ELECTRODE REM PT RTRN 9FT ADLT (ELECTROSURGICAL) ×2 IMPLANT
EVACUATOR SMOKE 8.L (FILTER) ×4 IMPLANT
GAUZE VASELINE 3X9 (GAUZE/BANDAGES/DRESSINGS) IMPLANT
GLOVE BIO SURGEON STRL SZ7 (GLOVE) ×4 IMPLANT
GLOVE BIOGEL PI IND STRL 7.0 (GLOVE) ×8 IMPLANT
GLOVE BIOGEL PI INDICATOR 7.0 (GLOVE) ×8
GLOVE ECLIPSE 6.5 STRL STRAW (GLOVE) ×12 IMPLANT
GOWN STRL REUS W/TWL LRG LVL3 (GOWN DISPOSABLE) ×40 IMPLANT
KIT ACCESSORY DA VINCI DISP (KITS) ×2
KIT ACCESSORY DVNC DISP (KITS) ×2 IMPLANT
LEGGING LITHOTOMY PAIR STRL (DRAPES) ×4 IMPLANT
LIQUID BAND (GAUZE/BANDAGES/DRESSINGS) ×4 IMPLANT
NEEDLE HYPO 22GX1.5 SAFETY (NEEDLE) ×4 IMPLANT
OCCLUDER COLPOPNEUMO (BALLOONS) ×4 IMPLANT
PACK ROBOT WH (CUSTOM PROCEDURE TRAY) ×4 IMPLANT
PAD POSITIONER PINK NONSTERILE (MISCELLANEOUS) ×4 IMPLANT
PAD PREP 24X48 CUFFED NSTRL (MISCELLANEOUS) ×8 IMPLANT
PROTECTOR NERVE ULNAR (MISCELLANEOUS) ×8 IMPLANT
SET CYSTO W/LG BORE CLAMP LF (SET/KITS/TRAYS/PACK) ×4 IMPLANT
SET IRRIG TUBING LAPAROSCOPIC (IRRIGATION / IRRIGATOR) ×4 IMPLANT
SET TRI-LUMEN FLTR TB AIRSEAL (TUBING) ×4 IMPLANT
STRIP CLOSURE SKIN 1/2X4 (GAUZE/BANDAGES/DRESSINGS) ×3 IMPLANT
SUT MNCRL AB 3-0 PS2 27 (SUTURE) IMPLANT
SUT VIC AB 0 CT1 27 (SUTURE) ×16
SUT VIC AB 0 CT1 27XBRD ANBCTR (SUTURE) ×4 IMPLANT
SUT VIC AB 0 CT1 27XBRD ANTBC (SUTURE) ×12 IMPLANT
SUT VICRYL 0 UR6 27IN ABS (SUTURE) ×8 IMPLANT
SYR 50ML LL SCALE MARK (SYRINGE) ×4 IMPLANT
SYSTEM CONVERTIBLE TROCAR (TROCAR) ×4 IMPLANT
TIP RUMI ORANGE 6.7MMX12CM (TIP) IMPLANT
TIP UTERINE 5.1X6CM LAV DISP (MISCELLANEOUS) IMPLANT
TIP UTERINE 6.7X10CM GRN DISP (MISCELLANEOUS) IMPLANT
TIP UTERINE 6.7X6CM WHT DISP (MISCELLANEOUS) IMPLANT
TIP UTERINE 6.7X8CM BLUE DISP (MISCELLANEOUS) ×4 IMPLANT
TOWEL OR 17X24 6PK STRL BLUE (TOWEL DISPOSABLE) ×12 IMPLANT
TROCAR 12M 150ML BLUNT (TROCAR) ×4 IMPLANT
TROCAR DISP BLADELESS 8 DVNC (TROCAR) ×2 IMPLANT
TROCAR DISP BLADELESS 8MM (TROCAR) ×2
TROCAR PORT AIRSEAL 5X120 (TROCAR) ×4 IMPLANT
TROCAR XCEL 12X100 BLDLESS (ENDOMECHANICALS) ×4 IMPLANT
WARMER LAPAROSCOPE (MISCELLANEOUS) ×4 IMPLANT
WATER STERILE IRR 1000ML POUR (IV SOLUTION) ×12 IMPLANT

## 2014-09-29 NOTE — Progress Notes (Signed)
Discharge teaching complete. Pt understood all instructions and did have any questions. Pt pushed via wheelchair out of the hospital and discharged home to family.

## 2014-09-29 NOTE — Op Note (Deleted)
Preoperative diagnosis: Dysmenorrhea, menorrhagia and right ovarian cyst  Postoperative diagnosis: Stage 4 endometriosis with frozen pelvis  Anesthesia: Gen.  Anesthesiologist: Dr. Lyndle Herrlich  Procedure: Robotically assisted lysis of adhesions followed by total abdominal hysterectomy, bilateral salpingo-oophorectomy and bilateral ureterolysi  Surgeon: Dr. Cletis Media  Asst.: Earnstine Regal PA-C  Estimated blood loss: 800 cc  Procedure: After being informed of the planned procedure with possible complications including but not limited to bleeding, infection, injury to other organs, need for laparotomy, possible need for morcellation with risks and benefits reviewed, expected hospital stay and recovery, informed consent is obtained and patient is taken to or #7. She is placed in  lithotomy position on a sticky mattress and beanbag with both arms padded and held on each side with arm board and bilateral knee-high sequential compressive devices. She is given general anesthesia with endotracheal intubation without any complication. She is prepped and draped in a sterile fashion. A three-way Foley catheter is inserted in her bladder.  Pelvic exam reveals: anteverted uterus, enlarged to 12-14 weeks and adnexa are not felt  A weighted speculum is inserted in the vagina and the anterior lip of the cervix is grasped with a tenaculum forcep. The uterus was then sounded at 8 cm. We easily dilate the cervix using Hegar dilator to  #27 which allows for easy placement of the intrauterine RUMI manipulator with a 3.5 KOH ring and a vaginal occluder. The ring is sutured to the cervix with 0 Vicryl.  Trocar placement is decided. We infiltrate 2 cm above the umbilicus with 10 cc of ropivacaine per protocol and perform a 10 mm vertical incision which is brought down bluntly to the fascia. The fascia is identified and grasped with Coker forceps. The fascia is incised with Mayo scissors. Peritoneum is entered bluntly. A  pursestring suture of 0 Vicryl is placed on the fascia and a 10 mm Hassan trocar is easily inserted in the abdominal cavity held in placed with a Purstring suture. This allows for easy insufflation of a pneumoperitoneum using warmed CO2 at a maximum pressure of 15 mm of mercury. 60 cc of Ropivacaine 0.5 % diluted 1 in 1 is sent in the pelvis and the patient is positioned in reverse Trendelenburg. We then placed two 53mm robotic trocar on the left, one 78mm robotic trocar on the right and one 8 mm patient's side assistant trocar on the right  after infiltrating every site  with ropivacaine per protocol. The robot is docked on the left of the patient after positioning her in Trendelenburg. A monopolar scissor is inserted in arm #1, a PK gyrus forcep is inserted in arm #2 and a Prograsp is inserted in arm #3.  Preparation and docking is completed in 58 minutes.  OBSERVATION: We see adhesions between the omentum and the midline incision but are able to go around to have a peak at the pelvis. We are unable to move to uterus despite the [facility] manipulator and we are unable to move the bowels back away from the posterior cul-de-sac. Liver is seen and normal appendix is not seen.  We proceed with systematic lysis of adhesion to free the omentum from the abdominal wall using both bipolar and monopolar energy. Then we can assess the pelvis more adequately. We note a frozen pelvis with a non-mobile uterus. We see part of the left Anexsia that appears abnormal and adherent to the uterus but we do not see the right adnexa. The bowel is completely adherent to the fundus of the  uterus. We proceed with an attempt of lysis of adhesion to try to free the bowel from the uterus using traction countertraction and blunt dissection. Unfortunately after 1 hour we are still unable to safely perform this lysis. The inflammatory appearance of the pelvis with tendency to was also prevents [place] from having good visualization. During the  dissection we do hit the wall of an ovarian cyst thought to be from the right side with evacuation of chocolate colored fluid compatible with endometriosis. At this point decision is made to proceed with an open approach. The robot is undocked. All trochars are removed. The Rumi intrauterine uterine manipulator with its KOH ring and vaginal occluder are all removed. The patient is repositioned in dorsal decubitus position still with a three-way Foley in and SCDs on. She is then reprepped and redraped in a sterile fashion. The fascia from the supraumbilical incision is closed with the previously placed pursestring suture of 0 Vicryl. All trocar incisions were then closed with subcuticular suture of 3-0 Monocryl and Dermabond.  We perform a midline incision over looking the previous midline incision with knife. This is brought down sharply to the fascia. The fascia is incised in the midline fashion with Mayo scissors. Peritoneum is entered bluntly. Self-retaining retractors are positioned and partial packing of the bowels is achieved with 3 moist laps.   The uterus is grasped with 2 Kelly forceps. We are able to identify the right round ligament which is sutured with a transfix suture of 0 Vicryl and section with cauterization.This gives [place] entry in the retroperitoneum. Bluntly we are able to dissect the bowels away from the fundus and posterior aspect of the uterus. We are able to proceed with this complete dissection until the posterior cul-de-sac is isolated. We proceed with sharp and blunt dissection of the retroperitoneal space with a complete urethrolysis in order to safely clamp and section the infundibulopelvic ligament on the right side. The infundibulopelvic ligament is clamped with Rogers clamp, sectioned and sutured with a transfix suture of 0 Vicryl as well as a free tie of 0 Vicryl. The anterior broad ligament is entered sharply all the way across the lower uterine segment allowing [place] to sharply and  bluntly dissect bladder below the cervix which is surprisingly without difficulty.the right vascular pedicle is then skeletonized and isolated on Rogers forceps. The vascular pedicle is then sectioned and sutured with a transfix suture of 0 Vicryl.   We then identified the left round ligament and suture it with a transfix suture of 0 Vicryl. It is sectioned with cauterization. Entry into the anterior broad ligament is then completed on the left side. Again we have to dissect the retroperitoneal space and perform a complete urethrolysis in order to safely clamp the infundibulopelvic ligament on the left side as well. Of note a large hydrosalpinx completely adherent to the uterus and a 5 cm endometrioma also completely adherent to the posterior aspect of the uterus. Once the ureter is easily visualize we are able to clamp the infundibulopelvic ligament and sectioned it. It is sutured with a transfix suture of 0 Vicryl and a free tie of 0 Vicryl. The left vascular pedicle is then skeletonized and clamped with a Rogers clamp keeping the ureter under direct visualization. This pedicle is sectioned and sutured with a transfix suture of 0 Vicryl.   After confirming a clear posterior cul-de-sac, cardinal ligaments are isolated on Rogers clamps, sectioned and sutured with a transfix suture of 0 Vicryl. Confirming bladder is dissected  below the cervix we can now isolate the uterosacral ligament on each side using Rogers clamp, sectioned at and suture it with a transfix suture of 0 Vicryl. Vaginal angles were then isolated on both sides with Rogers clamp. This allows [place] to completely remove the uterus with its cervix tubes and ovaries after a complete colpotomy.  Vaginal angles were sutured with a transfix suture of 0 Vicryl tied to the corresponding uterosacral ligament for suspension. The vaginal cuff is then closed with figure-of-eight stitches of 0 Vicryl.  We irrigated profusely with warm saline and complete  hemostasis with placement of Gelfoam in the posterior cul-de-sac.  Hemostasis is then confirmed adequate. Both ureters are visualized, normal in size with normal peristaltic activity.   All sponges and retractors are removed. Under fascia hemostasis is completed with cauterization. The fascia is closed with interrupted Smead Jones sutures with 0 Vicryl. The wound is irrigated with warm saline and hemostasis is completed with cauterization. A #10 JP drain is left in the incision and held to the skin with a 2-0 silk. The skin is closed with a subcuticular suture of 3-0 Monocryl and Steri-Strips.  Instruments and sponge count is complete 2.   Estimated blood loss is 800 cc.  The procedure is well tolerated by the patient is taken to recovery room in a well and stable condition.  Specimen: Uterus, cervix, tubes and ovaries all sent to pathology weighing 267 g

## 2014-09-29 NOTE — Anesthesia Procedure Notes (Signed)
Procedure Name: Intubation Date/Time: 09/29/2014 1:45 PM Performed by: Delsa Bern Pre-anesthesia Checklist: Patient identified, Emergency Drugs available, Suction available, Patient being monitored and Timeout performed Patient Re-evaluated:Patient Re-evaluated prior to inductionOxygen Delivery Method: Circle system utilized Preoxygenation: Pre-oxygenation with 100% oxygen Intubation Type: IV induction Ventilation: Mask ventilation without difficulty Laryngoscope Size: Miller and 2 Grade View: Grade I Tube type: Oral Tube size: 7.0 mm Number of attempts: 1 Placement Confirmation: ETT inserted through vocal cords under direct vision,  positive ETCO2 and breath sounds checked- equal and bilateral Secured at: 22 cm Tube secured with: Tape Dental Injury: Teeth and Oropharynx as per pre-operative assessment

## 2014-09-29 NOTE — H&P (View-Only) (Signed)
Lauren [name] is a 39 y.o.  female P 2-1-0-2 who is S/P Novasure Ablation, presents for hysterectomy because of irregular vaginal bleeding.  In July 2012 the patient had a Novasure Ablation for menorrhagia and was amenorrheic until September 2015 when she began bleeding  irregularly with the need to change her pad every 3 hours.  Fortunately she didn't have any cramping but due to a history of Protein S Deficiency she was not a candidate for hormonal management. She Admits to post coital bleeding but denies any changes in bowel or bladder function and has not had any vaginitis symptoms. A sono-hysterogram was attempted in October 2015 however, due to a fibroid the pipelle could not be advanced into the uterine cavity for saline instillation.  The scan otherwise showed a retroverted uterus measuring 7.80 x 7.81 x 4.66 cm with large fibroids filling the uterus:   Intramural:  anteriorly-3.1 x 2.3 cm and 2.3 x 1.9 cm,   fundal-1.5 x 0.8 cm and 1.6 x 1.4 cm, 9 x 5 mm and 7 mm x 8 mm;   Sub-serosal: posteriorly 5.6 x 3.6 cm;  endometrium obscured by fibroids;   left ovary-2.57 x 1.13 x 1.20 cm and right ovary-2.57 x 0.619 x 1.24 cm.   An endometrial biopsy revealed scant superficial benign endometrial fragments without atypia or malignancy.  Gonorrhea and chlamydia cultures in August were negative. A review of both medical and surgical management options were given to the patient regarding her symptoms however, she desires definitive therapy in the form of hysterectomy.   Past Medical History  OB History: G: 2;  P: 2-1-0-2;  C-section: 2002 with a fetal demise at 70 weeks and 2005 with twins  GYN History: menarche: 39 YO    LMP: Bled x 1 hour 09/03/2014    Contracepton bilateral tubal ligation  The patient reports a past history of: genital warts.  Denies history of abnormal PAP smear  Last PAP smear:04/2014  Medical History: Protein S Deficiency, Hypertension, Hypercholesterolemia, Vitamin D Deficiency,  Placental DVT, Obesity and Uterine Fibroids  Surgical History: 2012 Hysteroscopy, D&C with Resection of Endometrial Polyp and Novasure Endometrial Ablation;   2012  Tubal Sterilization and 1999 and 2000  Left Ankle Surgery with Internal Fixation Hardware Placed Denies problems with anesthesia or history of blood transfusions  Family History: Cancer (brain, ovarian, breast and colon), Hypercholesterolemia, Renal Disease, Diabetes Mellitus and  Hypertension  Social History: Divorced and employed with [employer];  Denies tobacco or alcohol use   Medications  Belviq 10 mg bid Alprazolam 0.5 mg prn Lisinopril/HCTZ   20/12.5 mg daily Simvastatin 10 mg daily  No Known Allergies   Denies sensitivity to peanuts, shellfish, soy, latex or adhesives.   ROS: Admits to glasses but denies headache, vision changes, nasal congestion, dysphagia, tinnitus, dizziness, hoarseness, cough,  chest pain, shortness of breath, nausea, vomiting, diarrhea,constipation,  urinary frequency, urgency  dysuria, hematuria, vaginitis symptoms, pelvic pain, swelling of joints,easy bruising,  myalgias, arthralgias, skin rashes, unexplained weight loss and except as is mentioned in the history of present illness, patient's review of systems is otherwise negative.   Physical Exam  Bp: 130/70   P: 72  R: 18   Temperature: 98.1 degrees F orally  Weight: 213 lbs.  Height: 5\' 7"   BMI: 33.4  Neck: supple without masses or thyromegaly Lungs: clear to auscultation Heart: regular rate and rhythm Abdomen: soft, non-tender and no organomegaly Pelvic:EGBUS- wnl; vagina-normal rugae, scant blood in vault; uterus-irregular and 10-12 weeks size,  cervix without lesions or motion tenderness; adnexae-no tenderness or masses Extremities:  no clubbing, cyanosis or edema   Assesment: Irregular Bleeding           Uterine Fibroids                      S/P Uterine Ablation   Disposition: Reviewed with the patient the indications for her  procedures along with the risks and benefits:    Benefits of the robotic approach include lesser postoperative pain, less blood loss during surgery, reduced risk of injury to other organs due to better visualization with a 3-D HD 10 times magnifying camera, shorter hospital stay between 0-1 night and rapid recovery with return to daily routine in 2-3 weeks. Although robotically-assisted hysterectomy has a longer operative time than traditional laparotomy, in a patient with good medical history, the benefits usually outweigh the risks.  Risks include bleeding, infection, injury to other organs, need for laparotomy, transient post-operative facial edema, increased risk of pelvic prolapse associated with any hysterectomy as well as earlier onset of menopause. Preservation or preventative removal of the ovaries was also reviewed and left to the patient's discretion. Finally, the option of supracervical hysterectomy was also discussed with the possible but yet unconfirmed benefits of reduction of pelvic prolapse. If supracervical hysterectomy is performed, Pap smear screening would continue as currently recommended, monthly bleeding is possible despite cauterization of cervical canal and a small but possible risk of cervical fibroid development is present.  Patient informed about FDA warning on use of morcellator dated 01/08/2013.   Discussed:  1. Incidence of post-operative diagnosis of sarcoma in women undergoing a hysterectomy is 2:1000 2. Annual incidence of leiomyosarcoma is 0.64/100,000 women 3. Sarcomas have the highest incidence in women over 65 4. Power morcellation involves risks of spreading tissue / disease. In he case of undiagnosed cancer, it may adversely affect the patient's prognosis.  A Miralax Bowel Prep was given to the patient to be completed the day before the surgery.   The patient verbalized understanding of these risks and has consented to proceed with a Robot Assisted Total  Laparoscopic Hysterectomy with Bilateral Salpingectomy and Possible Uterine Morecellation at [place] on September 24, 2014  at 11:15 a.m.   CSN# [id number]   Lauren [name] J. Florene Glen, PA-C  for Dr. Dede Query. Rivard

## 2014-09-29 NOTE — Op Note (Signed)
Preoperative diagnosis: Uterine fibroids with dysfunctional uterine bleeding  Postoperative diagnosis: Same   Anesthesia: General  Anesthesiologist: Dr. Rudean Curt  Procedure: Robotically assisted total hysterectomy with bilateral salpingectomy  Surgeon: Dr. Katharine Look Usha Slager  Assistant: Earnstine Regal P.A.-C.  Estimated blood loss: minimal  Procedure:  After being informed of the planned procedure with possible complications including but not limited to bleeding, infection, injury to other organs, need for laparotomy, possible need for morcellation with risks and benefits reviewed, expected hospital stay and recovery, informed consent is obtained and patient is taken to or #7. She is placed in  lithotomy position on a sticky mattress and beanbag with both arms padded and tucked on each side and bilateral knee-high sequential compressive devices. She is given general anesthesia with endotracheal intubation without any complication. She is prepped and draped in a sterile fashion. A three-way Foley catheter is inserted in her bladder.  Pelvic exam reveals: enlarged uterus at 14 weeks with a predominant posterior fibroid and 2 normal adnexa  A weighted speculum is inserted in the vagina and the anterior lip of the cervix is grasped with a tenaculum forcep. We proceed with a paracervical block and vaginal infiltration using ropivacaine 0.5% diluted 1 in 1 with saline. The uterus was then sounded at 8 cm. We easily dilate the cervix using Hegar dilator to  #27 which allows for easy placement of the intrauterine RUMI manipulator with a 3.0 KOH ring and a vaginal occluder. The ring is sutured to the cervix with 0 Vicryl.  Trocar placement is decided. We infiltrate 2 cm above the umbilicus with 10 cc of ropivacaine per protocol and perform a 10 mm vertical incision which is brought down bluntly to the fascia. The fascia is identified and grasped with Coker forceps. The fascia is incised with Mayo  scissors. Peritoneum is entered bluntly. A pursestring suture of 0 Vicryl is placed on the fascia and a 10 mm Hassan trocar is easily inserted in the abdominal cavity held in placed with a Purstring suture. This allows for easy insufflation of a pneumoperitoneum using warmed CO2 at a maximum pressure of 15 mm of mercury. 60 cc of Ropivacaine 0.5 % diluted 1 in 1 is sent in the pelvis and the patient is positioned in reverse Trendelenburg. We then placed one 59mm robotic trocar on the left, one 64mm robotic trocar on the right and one 8 mm patient's side assistant trocar on the right  after infiltrating every site  with ropivacaine per protocol. The robot is docked on the left of the patient after positioning her in Trendelenburg. A monopolar scissor is inserted in arm #1and a PK gyrus forcep is inserted in arm #2.  Preparation and docking is completed in 58 minutes.  Observation: Anterior and posterior cul-de-sac are normal. Both ovaries are normal, Both tubal remnants post tubal ligation are normal. The uterus is mobile and enlarged by multiple fibroids with a predominant posterior fibroid measuring 6 cm. Liver and gallbladder are normal. Appendix is not seen.  We start by proceeding with bilateral salpyngectomy cauterizing the mesosalpynx and sectioning them to remove both tubes through the assistant trocar.  We then proceed on the left side by cauterizing  the right utero-ovarian ligament and the right round ligament . This is then sectioned with monopolar scissors. This provides entry into the retroperitoneal space with an easy dissection of the anterior broad ligament. This was opened all the way to the right round ligament.   We then proceed with systematic dissection of  the bladder away from the anterior vaginal cuff which is easily identified with the KOH ring. The plane of dissection is easily identified and confirmed after filling the bladder with 200 cc of saline. We are able to dissect the bladder  2 cm below the KOH ring. We then opened the posterior left broad ligament all the way to the posterior KOH ring after identifying the full course of the left ureter.   Moving to the right side we cauterize the right round ligament and the right utero-ovarian ligament . This pedicle is the sectionned. Entry into the retroperitoneal space allows [place] to complete dissection of the bladder on the right side and skeletonized the uterine vessels. The right broad ligament is then dissected all the way to the posterior KOH ring after identifying the full course of the right ureter.   With pressure on the KOH ring and the bladder fully dissected below we are able to cauterize the uterine vessels on both sides at the level of the KOH ring.  The vaginal occluder is inflated and we proceed with a 360 colpotomy using an open monopolar scissors and freeing the uterus entirely .  The uterus is delivered vaginally after we proceeded with myomectomy of the large posterior fibroid. The vaginal occluder is reinserted in the vagina to maintain pneumoperitoneum.  Instruments are then modified for a suture cut in arm #1 and a long tip forcep in arm #2. We proceed with closure of the vaginal cuff with figure-of-eight stitches of 0 Vicryl. The needles are passed through the vagina except for the last one attached to the anterior abdominal wall for future removal. We irrigated profusely with warm saline and confirm a satisfactory hemostasis as well as 2 normal ureters with good mobility and no dilatation. A 1/2 sheet of Interceed is positioned on the vaginal cuff.  The last needle is removed via the 8 mm undocked trocar.  All instruments are then removed and the robot is undocked. Console time: 1  hour and 50 minutes.  All trochars are removed under direct visualization after evacuating the pneumoperitoneum.  The fascia of the supraumbilical incision is closed with the previously placed pursestring suture of 0 Vicryl. All  incisions are then closed with subcuticular suture of 3-0 Monocryl and Dermabond.  A speculum is inserted in the vagina to confirm a adequate closure of the vaginal cuff and good hemostasis.  Instrument and sponge count is complete x2. Estimated blood loss is minimal. The procedure is well tolerated by the patient is taken to recovery room in a well and stable condition.  Specimen: Uterus and tubes weighing 195 g

## 2014-09-29 NOTE — Discharge Summary (Signed)
Physician Discharge Summary  Patient ID: Lauren [name] MRN: [id number] DOB/AGE: 1976-09-10 39 y.o.  Admit date: 09/29/2014 Discharge date: 09/29/2014   Discharge Diagnoses: Irregular Uterine Bleeding and Uterine Fibroids Active Problems:   * No active hospital problems. *   Operation: Robot Assisted Total Laparoscopic Hysterectomy with Bilateral Salpingectomy   Discharged Condition: Good  Hospital Course: On the date of admission,  the patient underwent the aforementioned procedures and tolerated them well.  Post operative course was unremarkable with the patient resuming bowel and bladder function by the evening of surgery and was therefore deemed ready for discharge home.   Disposition: 01-Home or Self Care  Discharge Medications:    Medication List    STOP taking these medications        acetaminophen 500 MG tablet  Commonly known as:  TYLENOL     metroNIDAZOLE 500 MG tablet  Commonly known as:  FLAGYL      TAKE these medications        aspirin 325 MG tablet  Take 325 mg by mouth daily.     BELVIQ 10 MG Tabs  Generic drug:  Lorcaserin HCl  Take 10 mg by mouth daily.     Fish Oil 1000 MG Caps  Take 1 capsule by mouth daily.     ibuprofen 600 MG tablet  Commonly known as:  ADVIL,MOTRIN  1  PO  PC every 6 hours for 5 days then prn-pain     lisinopril-hydrochlorothiazide 20-12.5 MG per tablet  Commonly known as:  PRINZIDE,ZESTORETIC  Take 1 tablet by mouth daily.     ondansetron 4 MG tablet  Commonly known as:  ZOFRAN  Take 1 tablet (4 mg total) by mouth every 8 (eight) hours as needed for nausea or vomiting.     oxyCODONE-acetaminophen 5-325 MG per tablet  Commonly known as:  PERCOCET/ROXICET  Take 1-2 tablets by mouth every 6 (six) hours as needed for severe pain.     simvastatin 10 MG tablet  Commonly known as:  ZOCOR  Take 10 mg by mouth at bedtime.         Follow-up: Dr. Katharine Look Rivard on October 14, 1014 at 1:45  p.m.   SignedEarnstine Regal, PA-C 09/29/2014, 5:09 PM

## 2014-09-29 NOTE — Anesthesia Postprocedure Evaluation (Signed)
  Anesthesia Post Note  Patient: Lauren [name]  Procedure(s) Performed: Procedure(s) (LRB): ROBOTIC ASSISTED TOTAL HYSTERECTOMY  (N/A) BILATERAL SALPINGECTOMY (Bilateral)  Anesthesia type: GA  Patient location: PACU  Post pain: Pain level controlled  Post assessment: Post-op Vital signs reviewed  Last Vitals:  Filed Vitals:   09/29/14 1730  BP:   Pulse:   Temp:   Resp: 20    Post vital signs: Reviewed  Level of consciousness: sedated  Complications: No apparent anesthesia complications

## 2014-09-29 NOTE — Interval H&P Note (Signed)
History and Physical Interval Note:  09/29/2014 12:34 PM  [name] Lauren [name]  has presented today for surgery, with the diagnosis of Uterine Fibroids, Dysfunctional Uterine Bleeding, Dysmenorrhea  The various methods of treatment have been discussed with the patient and family. After consideration of risks, benefits and other options for treatment, the patient has consented to  Procedure(s): ROBOTIC ASSISTED TOTAL HYSTERECTOMY possible Morcellation (N/A) as a surgical intervention .  The patient's history has been reviewed, patient examined, no change in status, stable for surgery.  I have reviewed the patient's chart and labs.  Questions were answered to the patient's satisfaction.     Aidenjames Heckmann A

## 2014-09-29 NOTE — Discharge Instructions (Signed)
Call [facility] @ [phone] if: ° °You have a temperature greater than or equal to 100.4 degrees Farenheit orally °You have pain that is not made better by the pain medication given and taken as directed °You have excessive bleeding or problems urinating ° °Take Colace (Docusate Sodium/Stool Softener) 100 mg 2-3 times daily while taking narcotic pain medicine to avoid constipation or until bowel movements are regular. °Take Ibuprofen 600 mg with food every 6 hours for 5 days  then as needed for pain ° °You may drive after 1 week °You may walk up steps ° °You may shower tomorrow °You may resume a regular diet ° °Keep incisions clean and dry °Do not lift over 15 pounds for 6 weeks ° °Avoid anything in vagina for 6 weeks  ° °

## 2014-09-29 NOTE — Progress Notes (Signed)
Patient is alert and well Has voided. Pain is well managed with oral pain meds. Has ambulated and tolerates food. Patient is ready for discharge. Op findings and post-op instructions reviewed with patient and mother.

## 2014-09-29 NOTE — Discharge Summary (Signed)
  Physician Discharge Summary  Patient ID: Lauren [name] MRN: [id number] DOB/AGE: September 26, 1975 39 y.o.  Admit date: 09/29/2014 Discharge date: 09/29/2014  Admission Diagnoses: Uterine Fibroids, Dysfunctional Uterine Bleeding, Dysmenorrhea  Discharge Diagnoses: Uterine Fibroids, Dysfunctional Uterine Bleeding, Dysmenorrhea          Discharged Condition: good  Treatments: surgery: robotically assisted hysterectomy with bilateral salpyngectomy  Disposition: 01-Home or Self Care  Discharge Instructions    Discharge patient    Complete by:  As directed   Discharge patient home once she has ambulated, voided, tolerated p.o. and has good pain management with oral analgesia            Medication List    STOP taking these medications        acetaminophen 500 MG tablet  Commonly known as:  TYLENOL     metroNIDAZOLE 500 MG tablet  Commonly known as:  FLAGYL      TAKE these medications        aspirin 325 MG tablet  Take 325 mg by mouth daily.     BELVIQ 10 MG Tabs  Generic drug:  Lorcaserin HCl  Take 10 mg by mouth daily.     Fish Oil 1000 MG Caps  Take 1 capsule by mouth daily.     ibuprofen 600 MG tablet  Commonly known as:  ADVIL,MOTRIN  1  PO  PC every 6 hours for 5 days then prn-pain     lisinopril-hydrochlorothiazide 20-12.5 MG per tablet  Commonly known as:  PRINZIDE,ZESTORETIC  Take 1 tablet by mouth daily.     ondansetron 4 MG tablet  Commonly known as:  ZOFRAN  Take 1 tablet (4 mg total) by mouth every 8 (eight) hours as needed for nausea or vomiting.     oxyCODONE-acetaminophen 5-325 MG per tablet  Commonly known as:  PERCOCET/ROXICET  Take 1-2 tablets by mouth every 6 (six) hours as needed for severe pain.     simvastatin 10 MG tablet  Commonly known as:  ZOCOR  Take 10 mg by mouth at bedtime.           Follow-up Information    Follow up with [facility], MD On 10/14/2014.   Specialty:  Obstetrics and Gynecology   Why:  Appointment time is  1:45 p.m.   Contact information:   [place] [address] [phone]       Signed: Alwyn Pea, MD 09/29/2014, 10:20 PM

## 2014-09-29 NOTE — Anesthesia Preprocedure Evaluation (Signed)
Anesthesia Evaluation  Patient identified by MRN, date of birth, ID band Patient awake    Reviewed: Allergy & Precautions, H&P , Patient's Chart, lab work & pertinent test results, reviewed documented beta blocker date and time   History of Anesthesia Complications Negative for: history of anesthetic complications  Airway Mallampati: III  TM Distance: >3 FB Neck ROM: full    Dental   Pulmonary  breath sounds clear to auscultation        Cardiovascular Exercise Tolerance: Good hypertension, Rhythm:regular Rate:Normal     Neuro/Psych    GI/Hepatic   Endo/Other  Morbid obesity  Renal/GU      Musculoskeletal   Abdominal   Peds  Hematology  (+) Blood dyscrasia, ,   Anesthesia Other Findings   Reproductive/Obstetrics                             Anesthesia Physical Anesthesia Plan  ASA: III  Anesthesia Plan: General ETT   Post-op Pain Management:    Induction:   Airway Management Planned:   Additional Equipment:   Intra-op Plan:   Post-operative Plan:   Informed Consent: I have reviewed the patients History and Physical, chart, labs and discussed the procedure including the risks, benefits and alternatives for the proposed anesthesia with the patient or authorized representative who has indicated his/her understanding and acceptance.   Dental Advisory Given  Plan Discussed with: CRNA and Surgeon  Anesthesia Plan Comments:         Anesthesia Quick Evaluation

## 2014-09-29 NOTE — Transfer of Care (Signed)
Immediate Anesthesia Transfer of Care Note  Patient: Lauren [name]  Procedure(s) Performed: Procedure(s): ROBOTIC ASSISTED TOTAL HYSTERECTOMY  (N/A) BILATERAL SALPINGECTOMY (Bilateral)  Patient Location: PACU  Anesthesia Type:General  Level of Consciousness: awake, alert , oriented and patient cooperative  Airway & Oxygen Therapy: Patient Spontanous Breathing and Patient connected to nasal cannula oxygen  Post-op Assessment: Report given to PACU RN and Post -op Vital signs reviewed and stable  Post vital signs: Reviewed and stable  Complications: No apparent anesthesia complications

## 2014-09-30 ENCOUNTER — Encounter (HOSPITAL_COMMUNITY): Payer: Self-pay | Admitting: Obstetrics and Gynecology

## 2016-01-07 ENCOUNTER — Inpatient Hospital Stay (HOSPITAL_COMMUNITY)
Admission: AD | Admit: 2016-01-07 | Discharge: 2016-01-07 | Disposition: A | Discharge status: Home / Self Care | Payer: 59 | Source: Ambulatory Visit | Attending: Obstetrics and Gynecology | Admitting: Obstetrics and Gynecology

## 2016-01-07 ENCOUNTER — Encounter (HOSPITAL_COMMUNITY): Payer: Self-pay | Admitting: *Deleted

## 2016-01-07 NOTE — MAU Provider Note (Signed)
Lauren [name], Lauren [name] is a 40 yo, G3P0212 menopausal woman s/p hysterectomy presenting to the MAU unannounced with complaints of a condom stuck in her vagina for over 6 hrs after recent intercourse.  Denies pain or vaginal discharge.    History     Patient Active Problem List   Diagnosis Date Noted  . Irregular bleeding 09/29/2014    Chief Complaint  Patient presents with  . Foreign Body in Vagina   HPI  OB History    Gravida Para Term Preterm AB TAB SAB Ectopic Multiple Living   3 2  2 1 1   1 2       Past Medical History  Diagnosis Date  . Hypertension     checks bp at home - 110/70  . Pregnancy induced hypertension   . Pre-eclampsia, severe     delivered stillbirth  . Protein S deficiency ([place])     Took lovenox with twin pregnancy  . Preterm labor   . Hypercholesteremia   . Bacterial vaginosis   . Vitamin D deficiency 2012  . Trichomonas vaginitis 2012  . Endometrial polyp 2012  . Menorrhagia 2012  . Intermenstrual bleeding 2008  . Fibroids 2008  . Blood dyscrasia     protein s deficiency    Past Surgical History  Procedure Laterality Date  . Cesarean section  2005, 2003  . Ankle surgery  2000, 1999  . Novasure ablation  04/09/2011    Procedure: NOVASURE ABLATION;  Surgeon: Alwyn Pea, MD;  Location: [facility];  Service: Gynecology;  Laterality: N/A;  . Laparoscopic tubal ligation  04/09/2011    Procedure: LAPAROSCOPIC TUBAL LIGATION;  Surgeon: Alwyn Pea, MD;  Location: [facility];  Service: Gynecology;  Laterality: Bilateral;  . Wisdom tooth extraction    . Robotic assisted total hysterectomy N/A 09/29/2014    Procedure: ROBOTIC ASSISTED TOTAL HYSTERECTOMY ;  Surgeon: Delsa Bern, MD;  Location: [facility];  Service: Gynecology;  Laterality: N/A;  . Bilateral salpingectomy Bilateral 09/29/2014    Procedure: BILATERAL SALPINGECTOMY;  Surgeon: Delsa Bern, MD;  Location: [facility];  Service: Gynecology;  Laterality: Bilateral;    Family History  Problem Relation Age  of Onset  . Anesthesia problems Neg Hx   . Hypotension Neg Hx   . Malignant hyperthermia Neg Hx   . Pseudochol deficiency Neg Hx     Social History  Substance Use Topics  . Smoking status: Never Smoker   . Smokeless tobacco: Never Used  . Alcohol Use: 0.6 oz/week    1 Glasses of wine per week    Allergies: No Known Allergies  Prescriptions prior to admission  Medication Sig Dispense Refill Last Dose  . aspirin 325 MG tablet Take 325 mg by mouth daily.   01/06/2016 at Unknown time  . Diethylpropion HCl (TENUATE PO) Take 75 mg by mouth.   01/06/2016 at Unknown time  . lisinopril-hydrochlorothiazide (PRINZIDE,ZESTORETIC) 20-12.5 MG per tablet Take 1 tablet by mouth daily.     01/06/2016 at Unknown time  . simvastatin (ZOCOR) 10 MG tablet Take 20 mg by mouth at bedtime.    01/06/2016 at Unknown time  . Lorcaserin HCl (BELVIQ) 10 MG TABS Take 10 mg by mouth daily.   09/16/2014 at 0800  . Omega-3 Fatty Acids (FISH OIL) 1000 MG CAPS Take 1 capsule by mouth daily.    Not Taking at Unknown time    ROS Physical Exam   Blood pressure 141/82, pulse 78, temperature 98.1 F (36.7 C), resp. rate 18,  height 5' 6.5" (1.689 m), weight 97.433 kg (214 lb 12.8 oz).    Physical Exam  Constitutional: She is oriented to person, place, and time. She appears well-developed and well-nourished.  HENT:  Head: Normocephalic.  Eyes: Pupils are equal, round, and reactive to light.  Neck: Normal range of motion.  Cardiovascular: Normal rate and regular rhythm.   Respiratory: Effort normal and breath sounds normal.  GI: Soft.  Genitourinary: No vaginal discharge found.  Condom extracted from posterior vaginal canal   Musculoskeletal: Normal range of motion.  Neurological: She is alert and oriented to person, place, and time. She has normal reflexes.  Skin: Skin is warm and dry.  Psychiatric: She has a normal mood and affect. Her behavior is normal. Judgment and thought content normal.    ED Course   Assessment: Hx Hysterectomy with vagina cuff Condom extracted with ring forceps from poster vaginal canal  Plan:  DC home    [place], MSN 01/07/2016 7:36 AM

## 2016-01-07 NOTE — MAU Note (Signed)
Had sex earlier tonight and cannot get condom out. Has been in about 6 hours

## 2017-02-22 ENCOUNTER — Encounter (INDEPENDENT_AMBULATORY_CARE_PROVIDER_SITE_OTHER): Payer: 59 | Admitting: Vascular Surgery

## 2017-03-15 ENCOUNTER — Encounter (INDEPENDENT_AMBULATORY_CARE_PROVIDER_SITE_OTHER): Payer: 59 | Admitting: Vascular Surgery

## 2017-04-05 ENCOUNTER — Encounter (INDEPENDENT_AMBULATORY_CARE_PROVIDER_SITE_OTHER): Payer: 59 | Admitting: Vascular Surgery

## 2018-03-21 ENCOUNTER — Encounter (INDEPENDENT_AMBULATORY_CARE_PROVIDER_SITE_OTHER): Payer: Self-pay | Admitting: Vascular Surgery

## 2018-04-11 ENCOUNTER — Ambulatory Visit (INDEPENDENT_AMBULATORY_CARE_PROVIDER_SITE_OTHER): Payer: BLUE CROSS/BLUE SHIELD | Admitting: Vascular Surgery

## 2018-04-11 ENCOUNTER — Encounter (INDEPENDENT_AMBULATORY_CARE_PROVIDER_SITE_OTHER): Payer: Self-pay | Admitting: Vascular Surgery

## 2018-04-11 VITALS — BP 128/75 | HR 97 | Resp 18 | Ht 67.0 in | Wt 241.0 lb

## 2018-04-11 DIAGNOSIS — I10 Essential (primary) hypertension: Secondary | ICD-10-CM

## 2018-04-11 DIAGNOSIS — E7800 Pure hypercholesterolemia, unspecified: Secondary | ICD-10-CM | POA: Diagnosis not present

## 2018-04-11 DIAGNOSIS — D6859 Other primary thrombophilia: Secondary | ICD-10-CM | POA: Diagnosis not present

## 2018-04-11 DIAGNOSIS — I83813 Varicose veins of bilateral lower extremities with pain: Secondary | ICD-10-CM

## 2018-04-11 NOTE — Patient Instructions (Signed)

## 2018-04-11 NOTE — Assessment & Plan Note (Signed)
At current not requiring any anticoagulation which is reasonable.  She has never had an unprovoked DVT.  She does need to have a very low threshold for evaluation for DVT.  We also discussed use of compression stockings and other prophylactic measures with any travel.  She is scheduled to see a hematologist which sounds reasonable to me.

## 2018-04-11 NOTE — Progress Notes (Signed)
Patient ID: Lauren [name], female   DOB: September 18, 1976, 42 y.o.   MRN: [id number]  Chief Complaint  Patient presents with  . New Patient (Initial Visit)    painful bulging varicose veins    HPI Lauren [name] is a 42 y.o. female.   The patient presents with complaints of symptomatic varicosities of the lower extremities. The patient reports a long standing history of varicosities and they have become painful over time. There was no clear inciting event or causative factor that started the symptoms.  The left leg is more severly affected.  This was the leg that she had an ankle fracture and major surgery on several years ago.  The patient elevates the legs for relief. The pain is described as venous and tiredness in the legs. The symptoms are generally most severe in the evening, particularly when they have been on their feet for long periods of time.  Elevation has been used to try to improve the symptoms with limited success. The patient complains of left leg swelling as an associated symptom. The patient has previously had a pulmonary embolus with pregnancy about 12 years ago.  She has protein S deficiency and is scheduled to see hematologist because hers has retired.  She is no longer on anticoagulation.  She is also noticing increasing bruising in her arms as well as her legs.     Past Medical History:  Diagnosis Date  . Bacterial vaginosis   . Blood dyscrasia    protein s deficiency  . Endometrial polyp 2012  . Fibroids 2008  . Hypercholesteremia   . Hypertension    checks bp at home - 110/70  . Intermenstrual bleeding 2008  . Menorrhagia 2012  . Pre-eclampsia, severe    delivered stillbirth  . Pregnancy induced hypertension   . Preterm labor   . Protein S deficiency ([place])    Took lovenox with twin pregnancy  . Trichomonas vaginitis 2012  . Vitamin D deficiency 2012    Past Surgical History:  Procedure Laterality Date  . ANKLE SURGERY  2000, 1999  . BILATERAL  SALPINGECTOMY Bilateral 09/29/2014   Procedure: BILATERAL SALPINGECTOMY;  Surgeon: Delsa Bern, MD;  Location: [facility];  Service: Gynecology;  Laterality: Bilateral;  . CESAREAN SECTION  2005, 2003  . LAPAROSCOPIC TUBAL LIGATION  04/09/2011   Procedure: LAPAROSCOPIC TUBAL LIGATION;  Surgeon: Alwyn Pea, MD;  Location: [facility];  Service: Gynecology;  Laterality: Bilateral;  . NOVASURE ABLATION  04/09/2011   Procedure: NOVASURE ABLATION;  Surgeon: Alwyn Pea, MD;  Location: [facility];  Service: Gynecology;  Laterality: N/A;  . ROBOTIC ASSISTED TOTAL HYSTERECTOMY N/A 09/29/2014   Procedure: ROBOTIC ASSISTED TOTAL HYSTERECTOMY ;  Surgeon: Delsa Bern, MD;  Location: [facility];  Service: Gynecology;  Laterality: N/A;  . WISDOM TOOTH EXTRACTION      Family History  Problem Relation Age of Onset  . Anesthesia problems Neg Hx   . Hypotension Neg Hx   . Malignant hyperthermia Neg Hx   . Pseudochol deficiency Neg Hx   No bleeding disorders or clotting disorders.  No other family members with protein S deficiency that she knows of.  No porphyria  Social History Social History   Tobacco Use  . Smoking status: Never Smoker  . Smokeless tobacco: Never Used  Substance Use Topics  . Alcohol use: Yes    Alcohol/week: 0.6 oz    Types: 1 Glasses of wine per week  . Drug use: No  No Known Allergies  Current Outpatient Medications  Medication Sig Dispense Refill  . aspirin 325 MG tablet Take 325 mg by mouth daily.    . Diethylpropion HCl (TENUATE PO) Take 75 mg by mouth.    Marland Kitchen lisinopril-hydrochlorothiazide (PRINZIDE,ZESTORETIC) 20-12.5 MG per tablet Take 1 tablet by mouth daily.      . simvastatin (ZOCOR) 10 MG tablet Take 40 mg by mouth at bedtime.     . Vitamin D, Ergocalciferol, (DRISDOL) 50000 units CAPS capsule     . Lorcaserin HCl (BELVIQ) 10 MG TABS Take 10 mg by mouth daily.    . Omega-3 Fatty Acids (FISH OIL) 1000 MG CAPS Take 1 capsule by mouth daily.      No current  facility-administered medications for this visit.       REVIEW OF SYSTEMS (Negative unless checked)  Constitutional: [] Weight loss  [] Fever  [] Chills Cardiac: [] Chest pain   [] Chest pressure   [] Palpitations   [] Shortness of breath when laying flat   [] Shortness of breath at rest   [] Shortness of breath with exertion. Vascular:  [] Pain in legs with walking   [] Pain in legs at rest   [] Pain in legs when laying flat   [] Claudication   [] Pain in feet when walking  [] Pain in feet at rest  [] Pain in feet when laying flat   [x] History of DVT   [x] Phlebitis   [x] Swelling in legs   [x] Varicose veins   [] Non-healing ulcers Pulmonary:   [] Uses home oxygen   [] Productive cough   [] Hemoptysis   [] Wheeze  [] COPD   [] Asthma Neurologic:  [] Dizziness  [] Blackouts   [] Seizures   [] History of stroke   [] History of TIA  [] Aphasia   [] Temporary blindness   [] Dysphagia   [] Weakness or numbness in arms   [] Weakness or numbness in legs Musculoskeletal:  [x] Arthritis   [] Joint swelling   [] Joint pain   [] Low back pain Hematologic:  [x] Easy bruising  [] Easy bleeding   [] Hypercoagulable state   [] Anemic  [] Hepatitis Gastrointestinal:  [] Blood in stool   [] Vomiting blood  [x] Gastroesophageal reflux/heartburn   [] Abdominal pain Genitourinary:  [] Chronic kidney disease   [] Difficult urination  [] Frequent urination  [] Burning with urination   [] Hematuria Skin:  [] Rashes   [] Ulcers   [] Wounds Psychological:  [] History of anxiety   []  History of major depression.    Physical Exam BP 128/75 (BP Location: Right Arm)   Pulse 97   Resp 18   Ht 5\' 7"  (1.702 m)   Wt 241 lb (109.3 kg)   LMP  (LMP Unknown)   BMI 37.75 kg/m  Gen:  WD/WN, NAD Head: [place]/AT, No temporalis wasting.  Ear/Nose/Throat: Hearing grossly intact, dentition good Eyes: Sclera non-icteric. Conjunctiva clear Neck: Supple. Trachea midline Pulmonary:  Good air movement, no use of accessory muscles, respirations not labored.  Cardiac: RRR, No  JVD Vascular: Varicosities diffuse and measuring up to 2 mm in the right lower extremity        Varicosities diffuse and measuring up to 2 mm in the left lower extremity Vessel Right Left  Radial Palpable Palpable                          PT Palpable Palpable  DP Palpable Palpable   Gastrointestinal: soft, non-tender/non-distended.  Musculoskeletal: M/S 5/5 throughout.   No RLE edema.  1 + LLE edema.  Mild stasis dermatitis changes bilaterally Neurologic: Sensation grossly intact in extremities.  Symmetrical.  Speech is fluent.  Psychiatric: Judgment intact, Mood & affect appropriate for pt's clinical situation. Dermatologic: No rashes or ulcers noted.  No cellulitis or open wounds.    Radiology No results found.  Labs No results found for this or any previous visit (from the past 2160 hour(s)).  Assessment/Plan:  Hypertension blood pressure control important in reducing the progression of atherosclerotic disease. On appropriate oral medications.   Hypercholesteremia lipid control important in reducing the progression of atherosclerotic disease. Continue statin therapy   Protein S deficiency ([place]) At current not requiring any anticoagulation which is reasonable.  She has never had an unprovoked DVT.  She does need to have a very low threshold for evaluation for DVT.  We also discussed use of compression stockings and other prophylactic measures with any travel.  She is scheduled to see a hematologist which sounds reasonable to me.  Varicose veins of leg with pain, bilateral   The patient has symptoms consistent with chronic venous insufficiency. We discussed the natural history and treatment options for venous disease. I recommended the regular use of 20 - 30 mm Hg compression stockings, and prescribed these today. I recommended leg elevation and anti-inflammatories as needed for pain. I have also recommended a complete venous duplex to assess the venous system for reflux  or thrombotic issues.  Given her previous DVT, there may definitely be a postphlebitic situation.  This can be done at the patient's convenience. I will see the patient back after the duplex to assess the response to conservative management, and determine further treatment options.     Leotis Pain 04/11/2018, 4:37 PM   This note was created with Dragon medical transcription system.  Any errors from dictation are unintentional.

## 2018-04-11 NOTE — Assessment & Plan Note (Signed)
lipid control important in reducing the progression of atherosclerotic disease. Continue statin therapy  

## 2018-04-11 NOTE — Assessment & Plan Note (Signed)
blood pressure control important in reducing the progression of atherosclerotic disease. On appropriate oral medications.  

## 2018-05-21 ENCOUNTER — Ambulatory Visit (INDEPENDENT_AMBULATORY_CARE_PROVIDER_SITE_OTHER): Payer: BLUE CROSS/BLUE SHIELD | Admitting: Vascular Surgery

## 2018-05-21 ENCOUNTER — Encounter (INDEPENDENT_AMBULATORY_CARE_PROVIDER_SITE_OTHER): Payer: Self-pay | Admitting: Vascular Surgery

## 2018-05-21 ENCOUNTER — Encounter (INDEPENDENT_AMBULATORY_CARE_PROVIDER_SITE_OTHER): Payer: Self-pay

## 2018-05-21 ENCOUNTER — Ambulatory Visit (INDEPENDENT_AMBULATORY_CARE_PROVIDER_SITE_OTHER): Payer: BLUE CROSS/BLUE SHIELD

## 2018-05-21 VITALS — BP 156/82 | HR 56 | Resp 16 | Ht 67.0 in | Wt 239.8 lb

## 2018-05-21 DIAGNOSIS — I83811 Varicose veins of right lower extremities with pain: Secondary | ICD-10-CM

## 2018-05-21 DIAGNOSIS — I83812 Varicose veins of left lower extremities with pain: Secondary | ICD-10-CM | POA: Diagnosis not present

## 2018-05-21 DIAGNOSIS — I872 Venous insufficiency (chronic) (peripheral): Secondary | ICD-10-CM | POA: Diagnosis not present

## 2018-05-21 DIAGNOSIS — I83813 Varicose veins of bilateral lower extremities with pain: Secondary | ICD-10-CM | POA: Diagnosis not present

## 2018-05-21 DIAGNOSIS — I890 Lymphedema, not elsewhere classified: Secondary | ICD-10-CM | POA: Diagnosis not present

## 2018-05-21 NOTE — Progress Notes (Signed)
Subjective:    Patient ID: Lauren [name], female    DOB: 1976-02-18, 42 y.o.   MRN: [id number] Chief Complaint  Patient presents with  . Follow-up    bil le reflux   Patient last seen on April 11, 2018 and evaluation of painful varicose veins and edema associated with discomfort.  The patient does have a past medical history of a clotting disorder/DVT/PE.  Once her initial visit, the patient has been getting conservative therapy including wearing medical grade 1 compression socks, elevating her legs and remaining active on a daily basis.  This is provided minimal relief in regards to the pain in her varicosities and the amount and discomfort associated with her lower extremity edema.  The patient feels that her symptoms have progressed to the point that she is unable to function on a daily basis and they have become lifestyle limiting.  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  The patient was noticed to have reflux in the bilateral common femoral veins.  The patient was also noted to have a Baker's cyst behind the right knee.  The patient denies any ulcer formation to the bilateral lower extremity.  The patient denies any fever, nausea vomiting.  Review of Systems  Constitutional: Negative.   HENT: Negative.   Eyes: Negative.   Respiratory: Negative.   Cardiovascular: Positive for leg swelling.       Painful varicose veins  Gastrointestinal: Negative.   Endocrine: Negative.   Genitourinary: Negative.   Musculoskeletal: Negative.   Skin: Negative.   Allergic/Immunologic: Negative.   Neurological: Negative.   Hematological: Negative.   Psychiatric/Behavioral: Negative.       Objective:   Physical Exam  Constitutional: She is oriented to person, place, and time. She appears well-developed and  well-nourished. No distress.  HENT:  Head: Normocephalic and atraumatic.  Right Ear: External ear normal.  Left Ear: External ear normal.  Eyes: Pupils are equal, round, and reactive to light. Conjunctivae and EOM are normal.  Neck: Normal range of motion.  Cardiovascular: Normal rate, regular rhythm, normal heart sounds and intact distal pulses.  Pulses:      Radial pulses are 2+ on the right side, and 2+ on the left side.       Dorsalis pedis pulses are 2+ on the right side, and 2+ on the left side.       Posterior tibial pulses are 2+ on the right side, and 2+ on the left side.  Pulmonary/Chest: Effort normal and breath sounds normal.  Musculoskeletal: Normal range of motion. She exhibits edema (Mild to moderate nonpitting edema noted bilaterally).  Neurological: She is alert and oriented to person, place, and time.  Skin: Skin is warm and dry.  Scattered greater than 1 cm and less than 1 cm varicosities to the bilateral lower extremity.  There is no stasis dermatitis, fibrosis, cellulitis or active ulcerations noted at this time.  Psychiatric: She has a normal mood and affect. Her behavior is normal. Judgment and thought content normal.  Vitals reviewed.  BP (!) 156/82 (BP Location: Right Arm)   Pulse (!) 56   Resp 16   Ht 5\' 7"  (1.702 m)   Wt 239 lb 12.8 oz (108.8 kg)   LMP  (LMP Unknown)   BMI 37.56 kg/m   Past Medical History:  Diagnosis Date  . Bacterial vaginosis   . Blood dyscrasia    protein s deficiency  . Endometrial polyp 2012  . Fibroids 2008  . Hypercholesteremia   . Hypertension    checks bp at home - 110/70  . Intermenstrual bleeding 2008  . Menorrhagia 2012  . Pre-eclampsia, severe    delivered stillbirth  . Pregnancy induced hypertension   . Preterm labor   . Protein S deficiency ([place])    Took lovenox with twin pregnancy  . Trichomonas vaginitis 2012  . Vitamin D deficiency 2012   Social History   Socioeconomic History  . Marital status:  Divorced    Spouse name: Not on file  . Number of children: Not on file  . Years of education: Not on file  . Highest education level: Not on file  Occupational History  . Not on file  Social Needs  . Financial resource strain: Not on file  . Food insecurity:    Worry: Not on file    Inability: Not on file  . Transportation needs:    Medical: Not on file    Non-medical: Not on file  Tobacco Use  . Smoking status: Never Smoker  . Smokeless tobacco: Never Used  Substance and Sexual Activity  . Alcohol use: Yes    Alcohol/week: 1.0 standard drinks    Types: 1 Glasses of wine per week  . Drug use: No  . Sexual activity: Yes    Birth control/protection: Condom, Surgical    Comment: BTL, ablation  Lifestyle  . Physical activity:    Days per week: Not on file    Minutes per session: Not on file  . Stress: Not on file  Relationships  . Social connections:    Talks on phone: Not on file    Gets together: Not on file    Attends religious service: Not on file    Active member of club or organization: Not on file    Attends meetings of clubs or organizations: Not on file    Relationship status: Not on file  . Intimate partner violence:    Fear of current or ex partner: Not on file    Emotionally abused: Not on file    Physically abused: Not on file    Forced sexual activity: Not on file  Other Topics Concern  . Not on file  Social History Narrative  . Not on file   Past Surgical History:  Procedure Laterality Date  . ANKLE SURGERY  2000, 1999  . BILATERAL SALPINGECTOMY Bilateral 09/29/2014   Procedure: BILATERAL SALPINGECTOMY;  Surgeon: Delsa Bern, MD;  Location: [facility];  Service: Gynecology;  Laterality: Bilateral;  . CESAREAN SECTION  2005, 2003  . LAPAROSCOPIC TUBAL LIGATION  04/09/2011   Procedure: LAPAROSCOPIC TUBAL LIGATION;  Surgeon: Alwyn Pea, MD;  Location: [facility];  Service: Gynecology;  Laterality: Bilateral;  . NOVASURE ABLATION  04/09/2011   Procedure:  NOVASURE ABLATION;  Surgeon: Alwyn Pea, MD;  Location: [facility];  Service: Gynecology;  Laterality: N/A;  . ROBOTIC ASSISTED TOTAL HYSTERECTOMY N/A 09/29/2014   Procedure: ROBOTIC ASSISTED TOTAL HYSTERECTOMY ;  Surgeon: Delsa Bern, MD;  Location: [facility];  Service: Gynecology;  Laterality: N/A;  . WISDOM TOOTH EXTRACTION     Family History  Problem Relation Age of Onset  . Anesthesia problems Neg Hx   . Hypotension Neg Hx   . Malignant hyperthermia Neg Hx   . Pseudochol deficiency Neg Hx    No  Known Allergies     Assessment & Plan:  Patient last seen on April 11, 2018 and evaluation of painful varicose veins and edema associated with discomfort.  The patient does have a past medical history of a clotting disorder/DVT/PE.  Once her initial visit, the patient has been getting conservative therapy including wearing medical grade 1 compression socks, elevating her legs and remaining active on a daily basis.  This is provided minimal relief in regards to the pain in her varicosities and the amount and discomfort associated with her lower extremity edema.  The patient feels that her symptoms have progressed to the point that she is unable to function on a daily basis and they have become lifestyle limiting.  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  The patient was noticed to have reflux in the bilateral common femoral veins.  The patient was also noted to have a Baker's cyst behind the right knee.  The patient denies any ulcer formation to the bilateral lower extremity.  The patient denies any fever, nausea vomiting.  1. Varicose veins of leg with pain, bilateral - Stable I will bring patient back in approximately 3 months to assess her progress with conservative therapy and the addition of a lymphedema pump. At that  time, the patient is still experiencing pain along her varicosities we can applied to her insurance for saline/foam sclerotherapy.  2. Chronic venous insufficiency - New Due to the location of the patient's venous insufficiency being located in the bilateral common femoral vein/deep venous system she is not a candidate for endovenous laser ablation at this time The patient was advised to continue engaging conservative therapy including wearing her medical grade 1 compression socks, elevating her legs and remaining active on a daily basis. I will add a lymphedema pump to her daily therapy.  3. Lymphedema - New Despite conservative treatments including exercise, elevation and class I compression socks the patient still presents with stage I lymphedema. The patient's symptoms have progressed to the point that they have become lifestyle limiting and she is unable to function on a daily basis The patient would greatly benefit from the added therapy of a lymphedema pump Will applied to the patient's insurance  Current Outpatient Medications on File Prior to Visit  Medication Sig Dispense Refill  . aspirin 325 MG tablet Take 325 mg by mouth daily.    . Diethylpropion HCl (TENUATE PO) Take 75 mg by mouth.    Marland Kitchen lisinopril-hydrochlorothiazide (PRINZIDE,ZESTORETIC) 20-12.5 MG per tablet Take 1 tablet by mouth daily.      . Lorcaserin HCl (BELVIQ) 10 MG TABS Take 10 mg by mouth daily.    . Omega-3 Fatty Acids (FISH OIL) 1000 MG CAPS Take 1 capsule by mouth daily.     . simvastatin (ZOCOR) 10 MG tablet Take 40 mg by mouth at bedtime.     . Vitamin D, Ergocalciferol, (DRISDOL) 50000 units CAPS capsule      No current facility-administered medications on file prior to visit.    There are no Patient Instructions on file for this visit. No follow-ups on file.  Gelsey Amyx A Lasheika Ortloff, PA-C

## 2018-06-06 ENCOUNTER — Ambulatory Visit (INDEPENDENT_AMBULATORY_CARE_PROVIDER_SITE_OTHER): Payer: BLUE CROSS/BLUE SHIELD | Admitting: Vascular Surgery

## 2018-06-13 ENCOUNTER — Ambulatory Visit: Payer: 59 | Admitting: Hematology & Oncology

## 2018-06-13 ENCOUNTER — Other Ambulatory Visit: Payer: 59

## 2018-08-01 ENCOUNTER — Encounter (INDEPENDENT_AMBULATORY_CARE_PROVIDER_SITE_OTHER): Payer: Self-pay | Admitting: Nurse Practitioner

## 2018-08-01 ENCOUNTER — Ambulatory Visit (INDEPENDENT_AMBULATORY_CARE_PROVIDER_SITE_OTHER): Payer: BLUE CROSS/BLUE SHIELD | Admitting: Nurse Practitioner

## 2018-08-01 VITALS — BP 147/78 | HR 81 | Resp 16 | Ht 66.5 in | Wt 243.0 lb

## 2018-08-01 DIAGNOSIS — I890 Lymphedema, not elsewhere classified: Secondary | ICD-10-CM

## 2018-08-01 DIAGNOSIS — E7800 Pure hypercholesterolemia, unspecified: Secondary | ICD-10-CM | POA: Diagnosis not present

## 2018-08-01 DIAGNOSIS — I10 Essential (primary) hypertension: Secondary | ICD-10-CM | POA: Diagnosis not present

## 2018-08-01 DIAGNOSIS — I83813 Varicose veins of bilateral lower extremities with pain: Secondary | ICD-10-CM

## 2018-08-06 ENCOUNTER — Encounter (INDEPENDENT_AMBULATORY_CARE_PROVIDER_SITE_OTHER): Payer: Self-pay | Admitting: Nurse Practitioner

## 2018-08-06 NOTE — Progress Notes (Signed)
Subjective:    Patient ID: Lauren [name], female    DOB: April 21, 1976, 42 y.o.   MRN: [id number] Chief Complaint  Patient presents with  . Follow-up    76month discuss sclero    HPI  Lauren [name] is a 42 y.o. female with a past medical history of DVT and protein S deficiency that is following up with concern of painful varicose veins.The patient returns for followup evaluation 3 months after the initial visit. The patient continues to have pain in the lower extremities with dependency. The pain is lessened with elevation. Graduated compression stockings, Class I (20-30 mmHg), have been worn but the stockings do not eliminate the leg pain. Over-the-counter analgesics do not improve the symptoms. The degree of discomfort continues to interfere with daily activities. The patient notes the pain in the legs is causing problems with daily exercise, at the workplace and even with household activities and maintenance such as standing in the kitchen preparing meals and doing dishes.  The patient also has swelling of the bilateral lower extremities that is also hindering daily activities.  Venous ultrasound shows  no evidence of acute or chronic DVT.  Superficial reflux is not present bilaterally.  There is bilateral reflux in the common femoral veins. Past Medical History:  Diagnosis Date  . Bacterial vaginosis   . Blood dyscrasia    protein s deficiency  . Endometrial polyp 2012  . Fibroids 2008  . Hypercholesteremia   . Hypertension    checks bp at home - 110/70  . Intermenstrual bleeding 2008  . Menorrhagia 2012  . Pre-eclampsia, severe    delivered stillbirth  . Pregnancy induced hypertension   . Preterm labor   . Protein S deficiency ([place])    Took lovenox with twin pregnancy  . Trichomonas vaginitis 2012  . Vitamin D deficiency 2012    Past Surgical History:  Procedure Laterality Date  . ANKLE SURGERY  2000, 1999  . BILATERAL SALPINGECTOMY Bilateral 09/29/2014   Procedure:  BILATERAL SALPINGECTOMY;  Surgeon: Delsa Bern, MD;  Location: [facility];  Service: Gynecology;  Laterality: Bilateral;  . CESAREAN SECTION  2005, 2003  . LAPAROSCOPIC TUBAL LIGATION  04/09/2011   Procedure: LAPAROSCOPIC TUBAL LIGATION;  Surgeon: Alwyn Pea, MD;  Location: [facility];  Service: Gynecology;  Laterality: Bilateral;  . NOVASURE ABLATION  04/09/2011   Procedure: NOVASURE ABLATION;  Surgeon: Alwyn Pea, MD;  Location: [facility];  Service: Gynecology;  Laterality: N/A;  . ROBOTIC ASSISTED TOTAL HYSTERECTOMY N/A 09/29/2014   Procedure: ROBOTIC ASSISTED TOTAL HYSTERECTOMY ;  Surgeon: Delsa Bern, MD;  Location: [facility];  Service: Gynecology;  Laterality: N/A;  . WISDOM TOOTH EXTRACTION      Social History   Socioeconomic History  . Marital status: Divorced    Spouse name: Not on file  . Number of children: Not on file  . Years of education: Not on file  . Highest education level: Not on file  Occupational History  . Not on file  Social Needs  . Financial resource strain: Not on file  . Food insecurity:    Worry: Not on file    Inability: Not on file  . Transportation needs:    Medical: Not on file    Non-medical: Not on file  Tobacco Use  . Smoking status: Never Smoker  . Smokeless tobacco: Never Used  Substance and Sexual Activity  . Alcohol use: Yes    Alcohol/week: 1.0 standard drinks    Types: 1  Glasses of wine per week  . Drug use: No  . Sexual activity: Yes    Birth control/protection: Condom, Surgical    Comment: BTL, ablation  Lifestyle  . Physical activity:    Days per week: Not on file    Minutes per session: Not on file  . Stress: Not on file  Relationships  . Social connections:    Talks on phone: Not on file    Gets together: Not on file    Attends religious service: Not on file    Active member of club or organization: Not on file    Attends meetings of clubs or organizations: Not on file    Relationship status: Not on file  . Intimate  partner violence:    Fear of current or ex partner: Not on file    Emotionally abused: Not on file    Physically abused: Not on file    Forced sexual activity: Not on file  Other Topics Concern  . Not on file  Social History Narrative  . Not on file    Family History  Problem Relation Age of Onset  . Anesthesia problems Neg Hx   . Hypotension Neg Hx   . Malignant hyperthermia Neg Hx   . Pseudochol deficiency Neg Hx     No Known Allergies   Review of Systems   Review of Systems: Negative Unless Checked Constitutional: [] Weight loss  [] Fever  [] Chills Cardiac: [] Chest pain   []  Atrial Fibrillation  [] Palpitations   [] Shortness of breath when laying flat   [] Shortness of breath with exertion. Vascular:  [] Pain in legs with walking   [x] Pain in legs with standing  [x] History of DVT   [] Phlebitis   [x] Swelling in legs   [x] Varicose veins   [] Non-healing ulcers Pulmonary:   [] Uses home oxygen   [] Productive cough   [] Hemoptysis   [] Wheeze  [] COPD   [] Asthma Neurologic:  [] Dizziness   [] Seizures   [] History of stroke   [] History of TIA  [] Aphasia   [] Vissual changes   [] Weakness or numbness in arm   [] Weakness or numbness in leg Musculoskeletal:   [] Joint swelling   [] Joint pain   [] Low back pain  []  History of Knee Replacement Hematologic:  [] Easy bruising  [] Easy bleeding   [] Hypercoagulable state   [] Anemic Gastrointestinal:  [] Diarrhea   [] Vomiting  [] Gastroesophageal reflux/heartburn   [] Difficulty swallowing. Genitourinary:  [] Chronic kidney disease   [] Difficult urination  [] Anuric   [] Blood in urine Skin:  [] Rashes   [] Ulcers  Psychological:  [] History of anxiety   []  History of major depression  []  Memory Difficulties     Objective:   Physical Exam  BP (!) 147/78 (BP Location: Right Arm)   Pulse 81   Resp 16   Ht 5' 6.5" (1.689 m)   Wt 243 lb (110.2 kg)   LMP  (LMP Unknown)   BMI 38.63 kg/m   Gen: WD/WN, NAD Head: [place]/AT, No temporalis wasting.  Ear/Nose/Throat:  Hearing grossly intact, nares w/o erythema or drainage Eyes: PER, EOMI, sclera nonicteric.  Neck: Supple, no masses.  No JVD.  Pulmonary:  Good air movement, no use of accessory muscles.  Cardiac: RRR Vascular: Multiple scattered small varicosities, 1+ bilateral pitting edema  Vessel Right Left  Radial Palpable Palpable  Dorsalis Pedis Palpable Palpable  Posterior Tibial Palpable Palpable   Gastrointestinal: soft, non-distended. No guarding/no peritoneal signs.  Musculoskeletal: M/S 5/5 throughout.  No deformity or atrophy.  Neurologic: Pain and light touch intact  in extremities.  Symmetrical.  Speech is fluent. Motor exam as listed above. Psychiatric: Judgment intact, Mood & affect appropriate for pt's clinical situation. Dermatologic: No Venous rashes. No Ulcers Noted.  No changes consistent with cellulitis. Lymph : No Cervical lymphadenopathy, no lichenification or skin changes of chronic lymphedema.      Assessment & Plan:   1. Varicose veins of leg with pain, bilateral Venous ultrasound shows  no evidence of acute or chronic DVT.  Superficial reflux is not present bilaterally.  There is bilateral reflux in the common femoral veins.   Recommend:  The patient has persistent symptoms of pain and swelling that are having a negative impact on daily life and daily activities.  Patient should undergo injection sclerotherapy to treat the residual varicosities.  The risks, benefits and alternative therapies were reviewed in detail with the patient.  All questions were answered.  The patient agrees to proceed with sclerotherapy at their convenience.  The patient will continue wearing the graduated compression stockings and using the over-the-counter pain medications to treat her symptoms.     2. Lymphedema Venous ultrasound shows  no evidence of acute or chronic DVT.  Superficial reflux is not present bilaterally.  There is bilateral reflux in the common femoral  veins.  Recommend:  No surgery or intervention at this point in time.    I have reviewed my previous discussion with the patient regarding swelling and why it causes symptoms.  Patient will continue wearing graduated compression stockings class 1 (20-30 mmHg) on a daily basis. The patient will  beginning wearing the stockings first thing in the morning and removing them in the evening. The patient is instructed specifically not to sleep in the stockings.    In addition, behavioral modification including several periods of elevation of the lower extremities during the day will be continued.  This was reviewed with the patient during the initial visit.  The patient will also continue routine exercise, especially walking on a daily basis as was discussed during the initial visit.    Despite conservative treatments including graduated compression therapy class 1 and behavioral modification including exercise and elevation the patient  has not obtained adequate control of the lymphedema.  The patient still has stage 1 lymphedema and therefore, I believe that a lymph pump should be added to improve the control of the patient's lymphedema.  Additionally, a lymph pump is warranted because it will reduce the risk of cellulitis and ulceration in the future.  Patient should follow-up in six months    3. Hypercholesteremia Continue statin as ordered and reviewed, no changes at this time   4. Essential hypertension Continue antihypertensive medications as already ordered, these medications have been reviewed and there are no changes at this time.    Current Outpatient Medications on File Prior to Visit  Medication Sig Dispense Refill  . aspirin 325 MG tablet Take 325 mg by mouth daily.    . Diethylpropion HCl (TENUATE PO) Take 75 mg by mouth.    Marland Kitchen lisinopril-hydrochlorothiazide (PRINZIDE,ZESTORETIC) 20-12.5 MG per tablet Take 1 tablet by mouth daily.      . Lorcaserin HCl (BELVIQ) 10 MG TABS Take  10 mg by mouth daily.    . Omega-3 Fatty Acids (FISH OIL) 1000 MG CAPS Take 1 capsule by mouth daily.     . simvastatin (ZOCOR) 10 MG tablet Take 40 mg by mouth at bedtime.     . Vitamin D, Ergocalciferol, (DRISDOL) 50000 units CAPS capsule  No current facility-administered medications on file prior to visit.     There are no Patient Instructions on file for this visit. No follow-ups on file.   Kris Hartmann, NP  This note was completed with Sales executive.  Any errors are purely unintentional.

## 2018-08-15 ENCOUNTER — Encounter: Payer: Self-pay | Admitting: Hematology & Oncology

## 2018-08-15 ENCOUNTER — Other Ambulatory Visit: Payer: Self-pay

## 2018-08-15 ENCOUNTER — Inpatient Hospital Stay: Payer: BLUE CROSS/BLUE SHIELD

## 2018-08-15 ENCOUNTER — Inpatient Hospital Stay: Payer: BLUE CROSS/BLUE SHIELD | Attending: Hematology & Oncology | Admitting: Hematology & Oncology

## 2018-08-15 VITALS — BP 155/84 | HR 89 | Temp 98.1°F | Resp 18 | Wt 238.2 lb

## 2018-08-15 DIAGNOSIS — I10 Essential (primary) hypertension: Secondary | ICD-10-CM | POA: Insufficient documentation

## 2018-08-15 DIAGNOSIS — O2230 Deep phlebothrombosis in pregnancy, unspecified trimester: Secondary | ICD-10-CM

## 2018-08-15 DIAGNOSIS — I82409 Acute embolism and thrombosis of unspecified deep veins of unspecified lower extremity: Secondary | ICD-10-CM

## 2018-08-15 DIAGNOSIS — I82411 Acute embolism and thrombosis of right femoral vein: Secondary | ICD-10-CM

## 2018-08-15 DIAGNOSIS — E7800 Pure hypercholesterolemia, unspecified: Secondary | ICD-10-CM | POA: Insufficient documentation

## 2018-08-15 DIAGNOSIS — D6859 Other primary thrombophilia: Secondary | ICD-10-CM | POA: Insufficient documentation

## 2018-08-15 DIAGNOSIS — Z86718 Personal history of other venous thrombosis and embolism: Secondary | ICD-10-CM

## 2018-08-15 DIAGNOSIS — Z7982 Long term (current) use of aspirin: Secondary | ICD-10-CM | POA: Insufficient documentation

## 2018-08-15 DIAGNOSIS — Z79899 Other long term (current) drug therapy: Secondary | ICD-10-CM | POA: Diagnosis not present

## 2018-08-15 LAB — CBC WITH DIFFERENTIAL (CANCER CENTER ONLY)
Abs Immature Granulocytes: 0.02 10*3/uL (ref 0.00–0.07)
BASOS PCT: 0 %
Basophils Absolute: 0 10*3/uL (ref 0.0–0.1)
EOS ABS: 0.1 10*3/uL (ref 0.0–0.5)
Eosinophils Relative: 1 %
HCT: 40.8 % (ref 36.0–46.0)
Hemoglobin: 13 g/dL (ref 12.0–15.0)
IMMATURE GRANULOCYTES: 0 %
Lymphocytes Relative: 24 %
Lymphs Abs: 2.4 10*3/uL (ref 0.7–4.0)
MCH: 28.4 pg (ref 26.0–34.0)
MCHC: 31.9 g/dL (ref 30.0–36.0)
MCV: 89.3 fL (ref 80.0–100.0)
MONOS PCT: 4 %
Monocytes Absolute: 0.4 10*3/uL (ref 0.1–1.0)
NEUTROS PCT: 71 %
Neutro Abs: 7 10*3/uL (ref 1.7–7.7)
PLATELETS: 320 10*3/uL (ref 150–400)
RBC: 4.57 MIL/uL (ref 3.87–5.11)
RDW: 12.8 % (ref 11.5–15.5)
WBC Count: 10 10*3/uL (ref 4.0–10.5)
nRBC: 0 % (ref 0.0–0.2)

## 2018-08-15 LAB — CMP (CANCER CENTER ONLY)
ALT: 31 U/L (ref 10–47)
ANION GAP: 7 (ref 5–15)
AST: 21 U/L (ref 11–38)
Albumin: 4.1 g/dL (ref 3.5–5.0)
Alkaline Phosphatase: 77 U/L (ref 26–84)
BUN: 17 mg/dL (ref 7–22)
CHLORIDE: 107 mmol/L (ref 98–108)
CO2: 33 mmol/L (ref 18–33)
Calcium: 10 mg/dL (ref 8.0–10.3)
Creatinine: 1 mg/dL (ref 0.60–1.20)
GLUCOSE: 119 mg/dL — AB (ref 73–118)
POTASSIUM: 3.9 mmol/L (ref 3.3–4.7)
Sodium: 147 mmol/L — ABNORMAL HIGH (ref 128–145)
Total Bilirubin: 0.7 mg/dL (ref 0.2–1.6)
Total Protein: 7.8 g/dL (ref 6.4–8.1)

## 2018-08-15 MED ORDER — FUSION PLUS PO CAPS: 1.0000 | ORAL_CAPSULE | Freq: Every day | ORAL | 12 refills | Status: DC
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
## 2018-08-16 LAB — PROTEIN S PANEL
Protein S Activity: 92 % (ref 63–140)
Protein S Ag, Free: 95 % (ref 57–157)
Protein S Ag, Total: 99 % (ref 60–150)

## 2018-08-18 ENCOUNTER — Encounter (INDEPENDENT_AMBULATORY_CARE_PROVIDER_SITE_OTHER): Payer: Self-pay | Admitting: Vascular Surgery

## 2018-09-10 ENCOUNTER — Encounter (INDEPENDENT_AMBULATORY_CARE_PROVIDER_SITE_OTHER): Payer: Self-pay | Admitting: Nurse Practitioner

## 2018-09-10 ENCOUNTER — Encounter (INDEPENDENT_AMBULATORY_CARE_PROVIDER_SITE_OTHER): Payer: Self-pay

## 2018-09-10 ENCOUNTER — Ambulatory Visit (INDEPENDENT_AMBULATORY_CARE_PROVIDER_SITE_OTHER): Payer: BLUE CROSS/BLUE SHIELD | Admitting: Nurse Practitioner

## 2018-09-10 VITALS — BP 153/85 | HR 73 | Resp 18 | Ht 67.0 in | Wt 247.0 lb

## 2018-09-10 DIAGNOSIS — I83813 Varicose veins of bilateral lower extremities with pain: Secondary | ICD-10-CM

## 2018-09-10 NOTE — Progress Notes (Signed)
Varicose veins of left lower extremity with inflammation (454.1  I83.10) Current Plans   Indication: Patient presents with symptomatic varicose veins of the left lower extremity.   Procedure: Sclerotherapy using hypertonic saline mixed with 1% Lidocaine was performed on the left lower extremity. Compression wraps were placed. The patient tolerated the procedure well. 

## 2018-09-18 ENCOUNTER — Encounter (INDEPENDENT_AMBULATORY_CARE_PROVIDER_SITE_OTHER): Payer: Self-pay | Admitting: Nurse Practitioner

## 2018-09-18 ENCOUNTER — Ambulatory Visit (INDEPENDENT_AMBULATORY_CARE_PROVIDER_SITE_OTHER): Payer: BLUE CROSS/BLUE SHIELD | Admitting: Nurse Practitioner

## 2018-09-18 VITALS — BP 147/80 | HR 66 | Resp 16 | Ht 67.0 in | Wt 242.6 lb

## 2018-09-18 DIAGNOSIS — I83813 Varicose veins of bilateral lower extremities with pain: Secondary | ICD-10-CM | POA: Diagnosis not present

## 2018-09-19 ENCOUNTER — Encounter (INDEPENDENT_AMBULATORY_CARE_PROVIDER_SITE_OTHER): Payer: Self-pay | Admitting: Nurse Practitioner

## 2018-09-19 NOTE — Progress Notes (Signed)
Varicose veins of right  lower extremity with inflammation (454.1  I83.10) Current Plans   Indication: Patient presents with symptomatic varicose veins of the right  lower extremity.   Procedure: Sclerotherapy using hypertonic saline mixed with 1% Lidocaine was performed on the right lower extremity. Compression wraps were placed. The patient tolerated the procedure well. 

## 2018-09-23 ENCOUNTER — Ambulatory Visit (INDEPENDENT_AMBULATORY_CARE_PROVIDER_SITE_OTHER): Payer: BLUE CROSS/BLUE SHIELD | Admitting: Nurse Practitioner

## 2018-09-23 VITALS — BP 115/71 | HR 77 | Resp 17 | Ht 67.0 in | Wt 241.0 lb

## 2018-09-23 DIAGNOSIS — I83813 Varicose veins of bilateral lower extremities with pain: Secondary | ICD-10-CM

## 2018-09-23 NOTE — Progress Notes (Signed)
Varicose veins of bilateral  lower extremity with inflammation (454.1  I83.10) Current Plans   Indication: Patient presents with symptomatic varicose veins of the bilateral  lower extremity.   Procedure: Sclerotherapy using hypertonic saline mixed with 1% Lidocaine was performed on the bilateral lower extremity. Compression wraps were placed. The patient tolerated the procedure well. 

## 2018-10-03 ENCOUNTER — Telehealth (INDEPENDENT_AMBULATORY_CARE_PROVIDER_SITE_OTHER): Payer: Self-pay

## 2018-10-03 NOTE — Telephone Encounter (Signed)
Spoke with the patient and gave her the recommendations form Eulogio Ditch NP. She understood. See notes below.

## 2018-10-03 NOTE — Telephone Encounter (Signed)
When we did sclerotherapy we didn't treat her at the bend of her foot, so it would be unlikely to causing that pain.  Typically pain from sclerotherapy is localized to the areas that were treated. The procedure was done with small needles so it would not cause a DVT, it is also not typical for pain from a DVT to be in the bend of your foot.  Sometimes compression socks can cause a dull ache if they are too snug at the bend of the foot.  Try taking a break from the compression socks and use ibuprofen to see if this assists with the pain.

## 2018-10-03 NOTE — Telephone Encounter (Signed)
Patient called stating she is have a dull off and on ache at the bend of her foot, she has been treated with Sclerotherapy. She went to [facility] ER and [facility] ER and was told to call us.

## 2018-11-14 ENCOUNTER — Ambulatory Visit (INDEPENDENT_AMBULATORY_CARE_PROVIDER_SITE_OTHER): Payer: Managed Care, Other (non HMO) | Admitting: Nurse Practitioner

## 2018-11-14 ENCOUNTER — Encounter (INDEPENDENT_AMBULATORY_CARE_PROVIDER_SITE_OTHER): Payer: Self-pay | Admitting: Nurse Practitioner

## 2018-11-14 VITALS — BP 120/87 | HR 112 | Resp 18 | Wt 244.0 lb

## 2018-11-14 DIAGNOSIS — I83813 Varicose veins of bilateral lower extremities with pain: Secondary | ICD-10-CM | POA: Diagnosis not present

## 2018-11-14 NOTE — Progress Notes (Signed)
Varicose veins of right  lower extremity with inflammation (454.1  I83.10) Current Plans   Indication: Patient presents with symptomatic varicose veins of the right  lower extremity.   Procedure: Sclerotherapy using hypertonic saline mixed with 1% Lidocaine was performed on the right lower extremity. Compression wraps were placed. The patient tolerated the procedure well. 

## 2018-12-05 ENCOUNTER — Ambulatory Visit (INDEPENDENT_AMBULATORY_CARE_PROVIDER_SITE_OTHER): Payer: BLUE CROSS/BLUE SHIELD | Admitting: Nurse Practitioner

## 2018-12-19 ENCOUNTER — Ambulatory Visit (INDEPENDENT_AMBULATORY_CARE_PROVIDER_SITE_OTHER): Payer: BLUE CROSS/BLUE SHIELD | Admitting: Nurse Practitioner

## 2018-12-26 ENCOUNTER — Ambulatory Visit (INDEPENDENT_AMBULATORY_CARE_PROVIDER_SITE_OTHER): Payer: BLUE CROSS/BLUE SHIELD | Admitting: Nurse Practitioner

## 2019-01-16 ENCOUNTER — Ambulatory Visit (INDEPENDENT_AMBULATORY_CARE_PROVIDER_SITE_OTHER): Payer: BLUE CROSS/BLUE SHIELD | Admitting: Nurse Practitioner

## 2019-04-28 ENCOUNTER — Ambulatory Visit (INDEPENDENT_AMBULATORY_CARE_PROVIDER_SITE_OTHER): Payer: Managed Care, Other (non HMO) | Admitting: Family Medicine

## 2019-04-28 ENCOUNTER — Encounter (INDEPENDENT_AMBULATORY_CARE_PROVIDER_SITE_OTHER): Payer: Self-pay | Admitting: Family Medicine

## 2019-04-28 ENCOUNTER — Other Ambulatory Visit: Payer: Self-pay

## 2019-04-28 VITALS — BP 136/88 | HR 59 | Temp 98.3°F | Ht 66.0 in | Wt 250.0 lb

## 2019-04-28 DIAGNOSIS — Z6841 Body Mass Index (BMI) 40.0 and over, adult: Secondary | ICD-10-CM

## 2019-04-28 DIAGNOSIS — R5383 Other fatigue: Secondary | ICD-10-CM | POA: Diagnosis not present

## 2019-04-28 DIAGNOSIS — E7849 Other hyperlipidemia: Secondary | ICD-10-CM

## 2019-04-28 DIAGNOSIS — R739 Hyperglycemia, unspecified: Secondary | ICD-10-CM

## 2019-04-28 DIAGNOSIS — R0602 Shortness of breath: Secondary | ICD-10-CM

## 2019-04-28 DIAGNOSIS — Z1331 Encounter for screening for depression: Secondary | ICD-10-CM

## 2019-04-28 DIAGNOSIS — Z9189 Other specified personal risk factors, not elsewhere classified: Secondary | ICD-10-CM

## 2019-04-28 DIAGNOSIS — Z0289 Encounter for other administrative examinations: Secondary | ICD-10-CM

## 2019-04-28 NOTE — Progress Notes (Signed)
.  Office: [phone]  /  Fax: [phone]   HPI:   Chief Complaint: OBESITY  Lauren [name] (MR# [id number]) is a 43 y.o. female who presents on 04/28/2019 for obesity evaluation and treatment. Current BMI is Body mass index is 40.35 kg/m.Marland Kitchen Lauren [name] has struggled with obesity for years and has been unsuccessful in either losing weight or maintaining long term weight loss. Lauren [name] attended our information session and states she is currently in the action stage of change and ready to dedicate time achieving and maintaining a healthier weight.  Lauren [name] states her family eats meals together she thinks her family will eat healthier with  her her desired weight loss is 50 to 55 lbs. she started gaining weight after age 43 her heaviest weight ever was 265 lbs. she has significant food cravings issues  she skips meals frequently she is frequently drinking liquids with calories she sometimes makes poor food choices she frequently eats larger portions than normal  she has binge eating behaviors she struggles with emotional eating    Fatigue Lauren [name] feels her energy is lower than it should be. This has worsened with weight gain and has not worsened recently. Lauren [name] admits to daytime somnolence and she admits to waking up still tired. Patient is at risk for obstructive sleep apnea. Patent has a history of symptoms of daytime fatigue and morning fatigue. Patient generally gets 5 or 6 hours of sleep per night, and states they generally have restless sleep. Snoring is present. Apneic episodes are not present. Epworth Sleepiness Score is 5  Dyspnea on exertion Lauren [name] notes increasing shortness of breath with exercising and seems to be worsening over time with weight gain. She notes getting out of breath sooner with activity than she used to. This has not gotten worse recently. Lauren [name] denies orthopnea.  Hyperlipidemia Lauren [name] has hyperlipidemia and she is on statin. Lauren [name] has been trying to  improve her cholesterol levels with intensive lifestyle modification including a low saturated fat diet, exercise and weight loss. She denies any chest pain or myalgias. Lauren [name] is due for labs.  At risk for cardiovascular disease Lauren [name] is at a higher than average risk for cardiovascular disease due to obesity and hyperlipidemia. She currently denies any chest pain.  Hyperglycemia Lauren [name] has an elevated fasting glucose in Epic without a diagnosis of diabetes. She admits to polyphagia.  Depression Screen Lauren [name]'s Food and Mood (modified PHQ-9) score was  Depression screen PHQ 2/9 04/28/2019  Decreased Interest 0  Down, Depressed, Hopeless 0  PHQ - 2 Score 0  Altered sleeping 1  Tired, decreased energy 1  Change in appetite 1  Feeling bad or failure about yourself  0  Trouble concentrating 0  Moving slowly or fidgety/restless 0  Suicidal thoughts 0  PHQ-9 Score 3  Difficult doing work/chores Not difficult at all    ASSESSMENT AND PLAN:  Other fatigue - Plan: VITAMIN D 25 Hydroxy (Vit-D Deficiency, Fractures), Vitamin B12, Folate, T3, T4, free, TSH  SOB (shortness of breath) on exertion - Plan: VITAMIN D 25 Hydroxy (Vit-D Deficiency, Fractures), Vitamin B12, Folate, T3, T4, free, TSH  Other hyperlipidemia - Plan: Comprehensive metabolic panel, CBC With Differential, Lipid Panel With LDL/HDL Ratio  Hyperglycemia - Plan: Hemoglobin A1c, Insulin, random  Depression screening  At risk for heart disease  Class 3 severe obesity with serious comorbidity and body mass index (BMI) of 40.0 to 44.9 in adult, unspecified obesity type (HCC)  PLAN:  Fatigue Lauren [name] was informed that her fatigue may be  related to obesity, depression or many other causes. Labs will be ordered, and in the meanwhile [name] has agreed to work on diet, exercise and weight loss to help with fatigue. Proper sleep hygiene was discussed including the need for 7-8 hours of quality sleep each night. A sleep study  was not ordered based on symptoms and Epworth score.  Dyspnea on exertion Lauren [name]'s shortness of breath appears to be obesity related and exercise induced. She has agreed to work on weight loss and gradually increase exercise to treat her exercise induced shortness of breath. If [name] follows our instructions and loses weight without improvement of her shortness of breath, we will plan to refer to pulmonology. We will monitor this condition regularly. Lauren [name] agrees to this plan.  Hyperlipidemia Lauren [name] was informed of the American Heart Association Guidelines emphasizing intensive lifestyle modifications as the first line treatment for hyperlipidemia. We discussed many lifestyle modifications today in depth, and [name] will start to work on decreasing saturated fats such as fatty red meat, butter and many fried foods. She will also increase vegetables and lean protein in her diet and start to work on exercise and weight loss efforts. We will check labs and follow.  Cardiovascular risk counseling Lauren [name] was given extended (15 minutes) coronary artery disease prevention counseling today. She is 43 y.o. female and has risk factors for heart disease including obesity and hyperlipidemia. We discussed intensive lifestyle modifications today with an emphasis on specific weight loss instructions and strategies. Pt was also informed of the importance of increasing exercise and decreasing saturated fats to help prevent heart disease.  Hyperglycemia Fasting labs will be obtained and results with be discussed with Lauren [name] in 2 weeks at her follow up visit. In the meanwhile Lauren [name] was started on a lower simple carbohydrate diet and will work on weight loss efforts.  Depression Screen Lauren [name] had a negative depression screening. Depression is commonly associated with obesity and often results in emotional eating behaviors. We will monitor this closely and work on CBT to help improve the non-hunger eating  patterns.   Obesity Lauren [name] is currently in the action stage of change and her goal is to continue with weight loss efforts She has agreed to follow the Category 2 plan Lauren [name] has been instructed to work up to a goal of 150 minutes of combined cardio and strengthening exercise per week for weight loss and overall health benefits. We discussed the following Behavioral Modification Strategies today: no skipping meals, increasing lean protein intake, decreasing simple carbohydrates, increasing vegetables and work on meal planning and easy cooking plans  Lauren [name] has agreed to follow up with our clinic in 2 weeks. She was informed of the importance of frequent follow up visits to maximize her success with intensive lifestyle modifications for her multiple health conditions. She was informed we would discuss her lab results at her next visit unless there is a critical issue that needs to be addressed sooner. Lauren [name] agreed to keep her next visit at the agreed upon time to discuss these results.  ALLERGIES: No Known Allergies  MEDICATIONS: Current Outpatient Medications on File Prior to Visit  Medication Sig Dispense Refill   ALPRAZolam (XANAX) 0.5 MG tablet Take 0.5 mg by mouth at bedtime as needed for anxiety.     ergocalciferol (VITAMIN D2) 1.25 MG (50000 UT) capsule Take 50,000 Units by mouth once a week.     lisinopril-hydrochlorothiazide (PRINZIDE,ZESTORETIC) 20-12.5 MG per tablet Take 1 tablet by mouth daily.       simvastatin (  ZOCOR) 40 MG tablet Take 40 mg by mouth daily at 6 PM.     No current facility-administered medications on file prior to visit.     PAST MEDICAL HISTORY: Past Medical History:  Diagnosis Date   Anxiety    Bacterial vaginosis    Blood dyscrasia    protein s deficiency   Endometrial polyp 2012   Fibroids 2008   Hypercholesteremia    Hypertension    checks bp at home - 110/70   Intermenstrual bleeding 2008   Lymphedema of left leg    post  traumatic   Menorrhagia 2012   Pre-eclampsia, severe    delivered stillbirth   Pregnancy induced hypertension    Preterm labor    Protein S deficiency ([place])    Took lovenox with twin pregnancy   Trichomonas vaginitis 2012   Vitamin D deficiency 2012    PAST SURGICAL HISTORY: Past Surgical History:  Procedure Laterality Date   ANKLE SURGERY  2000, 1999   BILATERAL SALPINGECTOMY Bilateral 09/29/2014   Procedure: BILATERAL SALPINGECTOMY;  Surgeon: Delsa Bern, MD;  Location: [facility];  Service: Gynecology;  Laterality: Bilateral;   CESAREAN SECTION  2005, 2003   LAPAROSCOPIC TUBAL LIGATION  04/09/2011   Procedure: LAPAROSCOPIC TUBAL LIGATION;  Surgeon: Alwyn Pea, MD;  Location: [facility];  Service: Gynecology;  Laterality: Bilateral;   NOVASURE ABLATION  04/09/2011   Procedure: NOVASURE ABLATION;  Surgeon: Alwyn Pea, MD;  Location: [facility];  Service: Gynecology;  Laterality: N/A;   ROBOTIC ASSISTED TOTAL HYSTERECTOMY N/A 09/29/2014   Procedure: ROBOTIC ASSISTED TOTAL HYSTERECTOMY ;  Surgeon: Delsa Bern, MD;  Location: [facility];  Service: Gynecology;  Laterality: N/A;   WISDOM TOOTH EXTRACTION      SOCIAL HISTORY: Social History   Tobacco Use   Smoking status: Never Smoker   Smokeless tobacco: Never Used  Substance Use Topics   Alcohol use: Yes    Alcohol/week: 1.0 standard drinks    Types: 1 Glasses of wine per week   Drug use: No    FAMILY HISTORY: Family History  Problem Relation Age of Onset   Hyperlipidemia Mother    Hypertension Mother    Anesthesia problems Neg Hx    Hypotension Neg Hx    Malignant hyperthermia Neg Hx    Pseudochol deficiency Neg Hx     ROS: Review of Systems  Constitutional: Positive for malaise/fatigue.  Eyes:       + Wear Glasses or Contacts  Respiratory: Positive for shortness of breath (on exertion).   Cardiovascular: Negative for chest pain and orthopnea.  Musculoskeletal: Negative for myalgias.    Endo/Heme/Allergies:       Positive for polyphagia  Psychiatric/Behavioral: The patient has insomnia.     PHYSICAL EXAM: Blood pressure 136/88, pulse (!) 59, temperature 98.3 F (36.8 C), temperature source Oral, height 5\' 6"  (1.676 m), weight 250 lb (113.4 kg), SpO2 97 %. Body mass index is 40.35 kg/m. Physical Exam Vitals signs reviewed.  Constitutional:      Appearance: Normal appearance. She is well-developed. She is obese.  HENT:     Head: Normocephalic and atraumatic.     Nose: Nose normal.  Eyes:     General: No scleral icterus.    Extraocular Movements: Extraocular movements intact.  Neck:     Musculoskeletal: Normal range of motion and neck supple.     Thyroid: No thyromegaly.  Cardiovascular:     Rate and Rhythm: Normal rate and regular rhythm.  Pulmonary:  Effort: Pulmonary effort is normal. No respiratory distress.  Abdominal:     Palpations: Abdomen is soft.     Tenderness: There is no abdominal tenderness.  Musculoskeletal: Normal range of motion.     Comments: Range of Motion normal in all 4 extremities  Skin:    General: Skin is warm and dry.  Neurological:     Mental Status: She is alert and oriented to person, place, and time.     Coordination: Coordination normal.  Psychiatric:        Mood and Affect: Mood normal.        Behavior: Behavior normal.     RECENT LABS AND TESTS: BMET    Component Value Date/Time   NA 147 (H) 08/15/2018 1509   K 3.9 08/15/2018 1509   CL 107 08/15/2018 1509   CO2 33 08/15/2018 1509   GLUCOSE 119 (H) 08/15/2018 1509   BUN 17 08/15/2018 1509   CREATININE 1.00 08/15/2018 1509   CALCIUM 10.0 08/15/2018 1509   GFRNONAA >90 09/16/2014 1205   GFRAA >90 09/16/2014 1205   No results found for: HGBA1C No results found for: INSULIN CBC    Component Value Date/Time   WBC 10.0 08/15/2018 1509   WBC 7.9 09/16/2014 1205   RBC 4.57 08/15/2018 1509   HGB 13.0 08/15/2018 1509   HCT 40.8 08/15/2018 1509   PLT 320  08/15/2018 1509   MCV 89.3 08/15/2018 1509   MCH 28.4 08/15/2018 1509   MCHC 31.9 08/15/2018 1509   RDW 12.8 08/15/2018 1509   LYMPHSABS 2.4 08/15/2018 1509   MONOABS 0.4 08/15/2018 1509   EOSABS 0.1 08/15/2018 1509   BASOSABS 0.0 08/15/2018 1509   Iron/TIBC/Ferritin/ %Sat No results found for: IRON, TIBC, FERRITIN, IRONPCTSAT Lipid Panel  No results found for: CHOL, TRIG, HDL, CHOLHDL, VLDL, LDLCALC, LDLDIRECT Hepatic Function Panel     Component Value Date/Time   PROT 7.8 08/15/2018 1509   ALBUMIN 4.1 08/15/2018 1509   AST 21 08/15/2018 1509   ALT 31 08/15/2018 1509   ALKPHOS 77 08/15/2018 1509   BILITOT 0.7 08/15/2018 1509   No results found for: TSH Vitamin D There are no recent lab results  INDIRECT CALORIMETER done today shows a VO2 of 190 and a REE of 1324. Her calculated basal metabolic rate is 1829 thus her basal metabolic rate is worse than expected.       OBESITY BEHAVIORAL INTERVENTION VISIT  Today's visit was # 1   Starting weight: 250 lbs Starting date: 04/28/2019 Today's weight : 250 lbs Today's date: 04/28/2019 Total lbs lost to date: 0    04/28/2019  Height 5\' 6"  (1.676 m)  Weight 250 lb (113.4 kg)  BMI (Calculated) 40.37  BLOOD PRESSURE - SYSTOLIC 937  BLOOD PRESSURE - DIASTOLIC 88  Waist Measurement  42 inches   Body Fat % 48.6 %  Total Body Water (lbs) 90.8 lbs  RMR 1324    ASK: We discussed the diagnosis of obesity with Romeo Apple today and [name] agreed to give [place] permission to discuss obesity behavioral modification therapy today.  ASSESS: Earla has the diagnosis of obesity and her BMI today is 40.37 Kiarra is in the action stage of change   ADVISE: Lauren [name] was educated on the multiple health risks of obesity as well as the benefit of weight loss to improve her health. She was advised of the need for long term treatment and the importance of lifestyle modifications to improve her current health and to decrease  her risk of  future health problems.  AGREE: Multiple dietary modification options and treatment options were discussed and  Nelani agreed to follow the recommendations documented in the above note.  ARRANGE: Henlee was educated on the importance of frequent visits to treat obesity as outlined per CMS and USPSTF guidelines and agreed to schedule her next follow up appointment today.   I, Doreene Nest, am acting as transcriptionist for Dennard Nip, MD  I have reviewed the above documentation for accuracy and completeness, and I agree with the above. -Dennard Nip, MD

## 2019-04-29 LAB — FOLATE: Folate: 4.8 ng/mL (ref >3.0)

## 2019-04-29 LAB — CBC WITH DIFFERENTIAL
Basophils Absolute: 0 10*3/uL (ref 0.0–0.2)
Basos: 0 %
EOS (ABSOLUTE): 0.1 10*3/uL (ref 0.0–0.4)
Eos: 1 %
Hematocrit: 37.5 % (ref 34.0–46.6)
Hemoglobin: 12.1 g/dL (ref 11.1–15.9)
Immature Grans (Abs): 0 10*3/uL (ref 0.0–0.1)
Immature Granulocytes: 0 %
Lymphocytes Absolute: 2 10*3/uL (ref 0.7–3.1)
Lymphs: 22 %
MCH: 28.4 pg (ref 26.6–33.0)
MCHC: 32.3 g/dL (ref 31.5–35.7)
MCV: 88 fL (ref 79–97)
Monocytes Absolute: 0.4 10*3/uL (ref 0.1–0.9)
Monocytes: 5 %
Neutrophils Absolute: 6.4 10*3/uL (ref 1.4–7.0)
Neutrophils: 72 %
RBC: 4.26 x10E6/uL (ref 3.77–5.28)
RDW: 14.2 % (ref 11.7–15.4)
WBC: 9 10*3/uL (ref 3.4–10.8)

## 2019-04-29 LAB — HEMOGLOBIN A1C
Est. average glucose Bld gHb Est-mCnc: 105 mg/dL
Hgb A1c MFr Bld: 5.3 % (ref 4.8–5.6)

## 2019-04-29 LAB — COMPREHENSIVE METABOLIC PANEL
ALT: 21 IU/L (ref 0–32)
AST: 11 IU/L (ref 0–40)
Albumin/Globulin Ratio: 1.7 (ref 1.2–2.2)
Albumin: 4.5 g/dL (ref 3.8–4.8)
Alkaline Phosphatase: 100 IU/L (ref 39–117)
BUN/Creatinine Ratio: 17 (ref 9–23)
BUN: 14 mg/dL (ref 6–24)
Bilirubin Total: 0.3 mg/dL (ref 0.0–1.2)
CO2: 22 mmol/L (ref 20–29)
Calcium: 9.2 mg/dL (ref 8.7–10.2)
Chloride: 103 mmol/L (ref 96–106)
Creatinine, Ser: 0.83 mg/dL (ref 0.57–1.00)
GFR calc Af Amer: 100 mL/min/{1.73_m2} (ref >59)
GFR calc non Af Amer: 87 mL/min/{1.73_m2} (ref >59)
Globulin, Total: 2.7 g/dL (ref 1.5–4.5)
Glucose: 82 mg/dL (ref 65–99)
Potassium: 4.3 mmol/L (ref 3.5–5.2)
Sodium: 138 mmol/L (ref 134–144)
Total Protein: 7.2 g/dL (ref 6.0–8.5)

## 2019-04-29 LAB — LIPID PANEL WITH LDL/HDL RATIO
Cholesterol, Total: 251 mg/dL — ABNORMAL HIGH (ref 100–199)
HDL: 54 mg/dL (ref >39)
LDL Calculated: 170 mg/dL — ABNORMAL HIGH (ref 0–99)
LDl/HDL Ratio: 3.1 ratio (ref 0.0–3.2)
Triglycerides: 137 mg/dL (ref 0–149)
VLDL Cholesterol Cal: 27 mg/dL (ref 5–40)

## 2019-04-29 LAB — INSULIN, RANDOM: INSULIN: 22.2 u[IU]/mL (ref 2.6–24.9)

## 2019-04-29 LAB — T3: T3, Total: 108 ng/dL (ref 71–180)

## 2019-04-29 LAB — VITAMIN B12: Vitamin B-12: 850 pg/mL (ref 232–1245)

## 2019-04-29 LAB — TSH: TSH: 1.8 u[IU]/mL (ref 0.450–4.500)

## 2019-04-29 LAB — VITAMIN D 25 HYDROXY (VIT D DEFICIENCY, FRACTURES): Vit D, 25-Hydroxy: 24.2 ng/mL — ABNORMAL LOW (ref 30.0–100.0)

## 2019-04-29 LAB — T4, FREE: Free T4: 1.04 ng/dL (ref 0.82–1.77)

## 2019-05-04 NOTE — Progress Notes (Signed)
Office: [phone]  /  Fax: [phone]    Date: May 05, 2019   Appointment Start Time: 12:03pm Duration: 22 minutes Provider: Glennie Isle, Psy.D. Type of Session: Intake for Individual Therapy  Location of Patient: Work Location of Provider: Provider's Home Type of Contact: Telepsychological Visit via [employer]  Informed Consent: Prior to proceeding with today's appointment, two pieces of identifying information were obtained from Lauren [name] to verify identity. In addition, Lauren [name]'s physical location at the time of this appointment was obtained. Lauren [name] reported she was at work and provided the address. In the event of technical difficulties, Lauren [name] shared a phone number she could be reached at. Lauren [name] and this provider participated in today's telepsychological service. Also, Lauren [name] denied anyone else being present in the room or on the WebEx appointment.   The provider's role was explained to Lauren [name]. The provider reviewed and discussed issues of confidentiality, privacy, and limits therein (e.g., reporting obligations). In addition to verbal informed consent, written informed consent for psychological services was obtained from Lauren [name] prior to the initial intake interview. Written consent included information concerning the practice, financial arrangements, and confidentiality and patients' rights. Since the clinic is not a 24/7 crisis center, mental health emergency resources were shared, and the provider explained MyChart, e-mail, voicemail, and/or other messaging systems should be utilized only for non-emergency reasons. This provider also explained that information obtained during appointments will be placed in [name]'s medical record in a confidential manner and relevant information will be shared with other providers at [facility] that she meets with for coordination of care. Lauren [name] verbally acknowledged understanding of the aforementioned, and agreed  to use mental health emergency resources discussed if needed. Moreover, Lauren [name] agreed information may be shared with other [facility] providers as needed for coordination of care. By signing the service agreement document, Lauren [name] provided written consent for coordination of care.   Prior to initiating telepsychological services, Lauren [name] was provided with an informed consent document, which included the development of a safety plan (i.e., an emergency contact and emergency resources) in the event of an emergency/crisis. Lauren [name] expressed understanding of the rationale of the safety plan and provided consent for this provider to reach out to her emergency contact in the event of an emergency/crisis. Lauren [name] returned the completed consent form prior to today's appointment. This provider verbally reviewed the consent form during today's appointment prior to proceeding with the appointment. Lauren [name] verbally acknowledged understanding that she is ultimately responsible for understanding her insurance benefits as it relates to reimbursement of telepsychological and in-person services. This provider also reviewed confidentiality, as it relates to telepsychological services, as well as the rationale for telepsychological services. More specifically, this provider's clinic is limiting in-person visits due to COVID-19. Therapeutic services will resume to in-person appointments once deemed appropriate. Lauren [name] expressed understanding regarding the rationale for telepsychological services. In addition, this provider explained the telepsychological services informed consent document would be considered an addendum to the initial consent document/service agreement. Lauren [name] verbally consented to proceed.   Chief Complaint/HPI: Lauren [name] was referred by Dr. Dennard Nip. During the initial appointment with Dr. Dennard Nip at [facility] on April 28, 2019, Lauren [name] reported experiencing the  following: significant food cravings issues , frequently making poor food choices, frequently eating larger portions than normal , binge eating behaviors, struggling with emotional eating and skipping meals frequently. [name]'s Food and Mood (modified PHQ-9) score was 3.  During today's appointment, Lauren [name] was verbally administered a questionnaire assessing  various behaviors related to emotional eating. Lauren [name] endorsed the following: overeat when you are celebrating, eat certain foods when you are anxious, stressed, depressed, or your feelings are hurt, find food is comforting to you, overeat when you are worried about something and eat as a reward. She shared she craves "salt[y]" foods and chocolate. Lauren [name] reportedly engages in sporadic in emotional eating. In addition, she denied a history of binge eating. Lauren [name] denied a history of restricting food intake, purging and engagement in other compensatory strategies, and has never been diagnosed with an eating disorder. She also denied a history of treatment for emotional eating. Moreover, Lauren [name] indicated work stress triggers emotional eating, whereas engaging in self-talk makes emotional eating better. Furthermore, Lauren [name] denied other problems of concern.    Mental Status Examination:  Appearance: neat Behavior: cooperative Mood: euthymic Affect: mood congruent Speech: normal in rate, volume, and tone Eye Contact: appropriate Psychomotor Activity: appropriate Thought Process: linear, logical, and goal directed  Content/Perceptual Disturbances: denies suicidal and homicidal ideation, plan, and intent and no hallucinations, delusions, bizarre thinking or behavior reported or observed Orientation: time, person, place and purpose of appointment Cognition/Sensorium: memory, attention, language, and fund of knowledge intact  Insight: fair Judgment: fair  Family & Psychosocial History: Lauren [name] reported she is not in a relationship and she has  twins (ages 45). She indicated she is currently employed [employer]. Additionally, Lauren [name] shared her highest level of education obtained is an associate's degree. Currently, Lauren [name]'s social support system consists of her children and friends. Moreover, Lauren [name] stated she resides with her children.   Medical History:  Past Medical History:  Diagnosis Date   Anxiety    Bacterial vaginosis    Blood dyscrasia    protein s deficiency   Endometrial polyp 2012   Fibroids 2008   Hypercholesteremia    Hypertension    checks bp at home - 110/70   Intermenstrual bleeding 2008   Lymphedema of left leg    post traumatic   Menorrhagia 2012   Pre-eclampsia, severe    delivered stillbirth   Pregnancy induced hypertension    Preterm labor    Protein S deficiency ([place])    Took lovenox with twin pregnancy   Trichomonas vaginitis 2012   Vitamin D deficiency 2012   Past Surgical History:  Procedure Laterality Date   ANKLE SURGERY  2000, 1999   BILATERAL SALPINGECTOMY Bilateral 09/29/2014   Procedure: BILATERAL SALPINGECTOMY;  Surgeon: Delsa Bern, MD;  Location: [facility];  Service: Gynecology;  Laterality: Bilateral;   CESAREAN SECTION  2005, 2003   LAPAROSCOPIC TUBAL LIGATION  04/09/2011   Procedure: LAPAROSCOPIC TUBAL LIGATION;  Surgeon: Alwyn Pea, MD;  Location: [facility];  Service: Gynecology;  Laterality: Bilateral;   NOVASURE ABLATION  04/09/2011   Procedure: NOVASURE ABLATION;  Surgeon: Alwyn Pea, MD;  Location: [facility];  Service: Gynecology;  Laterality: N/A;   ROBOTIC ASSISTED TOTAL HYSTERECTOMY N/A 09/29/2014   Procedure: ROBOTIC ASSISTED TOTAL HYSTERECTOMY ;  Surgeon: Delsa Bern, MD;  Location: [facility];  Service: Gynecology;  Laterality: N/A;   WISDOM TOOTH EXTRACTION     Current Outpatient Medications on File Prior to Visit  Medication Sig Dispense Refill   ALPRAZolam (XANAX) 0.5 MG tablet Take 0.5 mg by mouth at bedtime as needed for anxiety.      ergocalciferol (VITAMIN D2) 1.25 MG (50000 UT) capsule Take 50,000 Units by mouth once a week.     lisinopril-hydrochlorothiazide (PRINZIDE,ZESTORETIC) 20-12.5 MG per tablet Take 1 tablet by mouth  daily.       simvastatin (ZOCOR) 40 MG tablet Take 40 mg by mouth daily at 6 PM.     No current facility-administered medications on file prior to visit.   Mikyla denied a history of head injuries and loss of consciousness.   Mental Health History: Maxx reported attending individual therapy with a psychiatrist when her daughter passed away approximately 10 years ago for approximately "less than 6 months." She recalled being prescribed Xanax. She continues to take Xanax PRN and it is currently prescribed by her PCP. Benisha denied a history of hospitalizations for psychiatric concerns. Rema denied a family history of mental health related concerns. Marleah denied a trauma history, including psychological, physical  and sexual abuse, as well as neglect.   Sydelle described her typical mood as "relaxed." Aside from concerns noted above, Imogine reported experiencing worry thoughts about her weight and health. She noted, "I'm fat and I don't want to be any fatter." Analiyah endorsed current alcohol use. More specifically, she reported consuming 2-3 standard glasses of wine throughout the week with dinner. She denied tobacco use. She denied illicit/recreational substance use. She denied caffeine intake. Furthermore, Lauren [name] denied experiencing the following: hopelessness, memory concerns, hallucinations and delusions, paranoia, mania, social withdrawal and decreased motivation. She also denied experiencing panic attacks. She also denied history of and current suicidal ideation, plan, and intent; history of and current homicidal ideation, plan, and intent; and history of and current engagement in self-harm.  The following strengths were reported by Lauren [name]: hard worker, really smart, focused, and really funny.  The following strengths were observed by this provider: ability to express thoughts and feelings during the therapeutic session, ability to establish and benefit from a therapeutic relationship, ability to learn and practice coping skills, willingness to work toward established goal(s) with the clinic and ability to engage in reciprocal conversation.  Legal History: Eveleen denied a history of legal involvement.   Structured Assessment Results: The Patient Health Questionnaire-9 (PHQ-9) is a self-report measure that assesses symptoms and severity of depression over the course of the last two weeks. Jesselyn obtained a score of 0. Little interest or pleasure in doing things 0  Feeling down, depressed, or hopeless 0  Trouble falling or staying asleep, or sleeping too much 0  Feeling tired or having little energy 0  Poor appetite or overeating 0  Feeling bad about yourself --- or that you are a failure or have let yourself or your family down 0  Trouble concentrating on things, such as reading the newspaper or watching television 0  Moving or speaking so slowly that other people could have noticed? Or the opposite --- being so fidgety or restless that you have been moving around a lot more than usual 0  Thoughts that you would be better off dead or hurting yourself in some way 0  PHQ-9 Score 0    The Generalized Anxiety Disorder-7 (GAD-7) is a brief self-report measure that assesses symptoms of anxiety over the course of the last two weeks. Maryetta obtained a score of 0. Feeling nervous, anxious, on edge 0  Not being able to stop or control worrying 0  Worrying too much about different things 0  Trouble relaxing 0  Being so restless that it's hard to sit still 0  Becoming easily annoyed or irritable 0  Feeling afraid as if something awful might happen 0  GAD-7 Score 0   Interventions: A chart review was conducted prior to the clinical intake interview. The PHQ-9,  and GAD-7 were verbally  administered as well as a Mood and Food questionnaire to assess various behaviors related to emotional eating. Throughout session, empathic reflections and validation was provided. Psychoeducation regarding emotional versus physical hunger was provided. Delano was sent a handout via e-mail to increase awareness of hunger patterns and subsequent eating. Lauren [name] provided verbal consent during today's appointment for this provider to send the handout via e-mail.   Provisional DSM-5 Diagnosis: 300.09 (F41.8) Other Specified Anxiety Disorder, Emotional Eating Behaviors  Plan: Margy declined future appointments with this provider. She acknowledged understanding that she may request a follow-up appointment in the future as long as she is still established with the clinic. No further follow-up planned by this provider.

## 2019-05-05 ENCOUNTER — Other Ambulatory Visit: Payer: Self-pay

## 2019-05-05 ENCOUNTER — Ambulatory Visit (INDEPENDENT_AMBULATORY_CARE_PROVIDER_SITE_OTHER): Payer: 59 | Admitting: Psychology

## 2019-05-05 ENCOUNTER — Telehealth (INDEPENDENT_AMBULATORY_CARE_PROVIDER_SITE_OTHER): Payer: Self-pay | Admitting: Psychology

## 2019-05-05 DIAGNOSIS — F418 Other specified anxiety disorders: Secondary | ICD-10-CM | POA: Diagnosis not present

## 2019-05-05 NOTE — Telephone Encounter (Signed)
Entered in error

## 2019-05-12 ENCOUNTER — Ambulatory Visit (INDEPENDENT_AMBULATORY_CARE_PROVIDER_SITE_OTHER): Payer: Managed Care, Other (non HMO) | Admitting: Family Medicine

## 2019-05-12 ENCOUNTER — Encounter (INDEPENDENT_AMBULATORY_CARE_PROVIDER_SITE_OTHER): Payer: Self-pay | Admitting: Family Medicine

## 2019-05-12 ENCOUNTER — Other Ambulatory Visit: Payer: Self-pay

## 2019-05-12 VITALS — BP 150/91 | HR 67 | Temp 98.4°F | Ht 66.0 in | Wt 252.0 lb

## 2019-05-12 DIAGNOSIS — E559 Vitamin D deficiency, unspecified: Secondary | ICD-10-CM

## 2019-05-12 DIAGNOSIS — Z9189 Other specified personal risk factors, not elsewhere classified: Secondary | ICD-10-CM | POA: Diagnosis not present

## 2019-05-12 DIAGNOSIS — E8881 Metabolic syndrome: Secondary | ICD-10-CM

## 2019-05-12 DIAGNOSIS — Z6841 Body Mass Index (BMI) 40.0 and over, adult: Secondary | ICD-10-CM

## 2019-05-12 MED ORDER — VITAMIN D (ERGOCALCIFEROL) 1.25 MG (50000 UNIT) PO CAPS: 50000.0000 [IU] | ORAL_CAPSULE | ORAL | 0 refills | Status: DC

## 2019-05-12 MED ORDER — METFORMIN HCL 500 MG PO TABS: 500.0000 mg | ORAL_TABLET | Freq: Every day | ORAL | 0 refills | Status: DC

## 2019-05-14 NOTE — Progress Notes (Signed)
Office: [phone]  /  Fax: [phone]   HPI:   Chief Complaint: OBESITY Lauren [name] is here to discuss her progress with her obesity treatment plan. She is on the Category 2 plan and is following her eating plan approximately 70% of the time. She states she is exercising 0 minutes 0 times per week. Lauren [name] struggled to eat all of the food on her plan and didn't like certain foods. She is retaining some fluid today but has lost approximately 1.5 fat since her last visit.  Her weight is 252 lb (114.3 kg) today and has had a weight gain of 2 lbs since her last visit. She has lost 0 lbs since starting treatment with [place].  Vitamin D deficiency Lauren [name] has a new diagnosis of Vitamin D deficiency. She is currently not taking Vit D and denies nausea, vomiting or muscle weakness but does report fatigue.  Hyperlipidemia Lauren [name] has hyperlipidemia and has been trying to improve her cholesterol levels with intensive lifestyle modification including a low saturated fat diet, exercise and weight loss. She is not on a statin and would like to attempt to diet control. She denies any chest pain.  Insulin Resistance Lauren [name] has a new diagnosis of insulin resistance based on her elevated fasting insulin level >5. Although Lauren [name]'s blood glucose readings are still under good control, insulin resistance puts her at greater risk of metabolic syndrome and diabetes. Her fasting insulin is elevated and she reports polyphagia.   At risk for diabetes Lauren [name] is at higher than average risk for developing diabetes due to her obesity. She currently denies polyuria or polydipsia.  ASSESSMENT AND PLAN:  Vitamin D deficiency - Plan: Vitamin D, Ergocalciferol, (DRISDOL) 1.25 MG (50000 UT) CAPS capsule  Insulin resistance - Plan: metFORMIN (GLUCOPHAGE) 500 MG tablet  At risk for diabetes mellitus  Class 3 severe obesity with serious comorbidity and body mass index (BMI) of 40.0 to 44.9 in adult, unspecified obesity  type (Lauren [name])  PLAN:  Vitamin D Deficiency Lauren [name] was informed that low Vitamin D levels contributes to fatigue and are associated with obesity, breast, and colon cancer. She agrees to start taking prescription Vit D @ 50,000 IU every week #4 with 0 refills and will follow-up for routine testing of Vitamin D, at least 2-3 times per year. She was informed of the risk of over-replacement of Vitamin D and agrees to not increase her dose unless she discusses this with [place] first. Lauren [name] agrees to follow-up with our clinic in 2 weeks.  Hyperlipidemia Lauren [name] was informed of the American Heart Association Guidelines emphasizing intensive lifestyle modifications as the first line treatment for hyperlipidemia. We discussed many lifestyle modifications today in depth, and Nychelle will continue to work on decreasing saturated fats such as fatty red meat, butter and many fried foods. She will continue diet prescription, increase vegetables and lean protein in her diet, and continue to work on exercise and weight loss efforts. We will recheck labs in 3 months.  Insulin Resistance Lauren [name] will continue to work on weight loss, exercise, and decreasing simple carbohydrates in her diet to help decrease the risk of diabetes. We dicussed metformin including benefits and risks. She was informed that eating too many simple carbohydrates or too many calories at one sitting increases the likelihood of GI side effects. Lauren [name] will start metformin 500 mg QAM #30 with 0 refills and agrees to follow-up with our clinic in 2 weeks. She will continue her diet and have labs rechecked in 3 months.  Diabetes risk  counseling Lauren [name] was given extended (30 minutes) diabetes prevention counseling today. She is 43 y.o. female and has risk factors for diabetes including obesity. We discussed intensive lifestyle modifications today with an emphasis on weight loss as well as increasing exercise and decreasing simple carbohydrates in her  diet.  Obesity Lauren [name] is currently in the action stage of change. As such, her goal is to continue with weight loss efforts. She has agreed to follow the Category 2 plan. Lauren [name] has been instructed to work up to a goal of 150 minutes of combined cardio and strengthening exercise per week for weight loss and overall health benefits. We discussed the following Behavioral Modification Strategies today: increasing lean protein intake, decreasing simple carbohydrates, and Lauren skipping meals.   Lauren [name] has agreed to follow-up with our clinic in 2 weeks. She was informed of the importance of frequent follow-up visits to maximize her success with intensive lifestyle modifications for her multiple health conditions.  [name]: Lauren [name]  MEDICATIONS: Current Outpatient Medications on File Prior to Visit  Medication Sig Dispense Refill   ALPRAZolam (XANAX) 0.5 MG tablet Take 0.5 mg by mouth at bedtime as needed for anxiety.     lisinopril-hydrochlorothiazide (PRINZIDE,ZESTORETIC) 20-12.5 MG per tablet Take 1 tablet by mouth daily.       simvastatin (ZOCOR) 40 MG tablet Take 40 mg by mouth daily at 6 PM.     Lauren current facility-administered medications on file prior to visit.     PAST MEDICAL HISTORY: Past Medical History:  Diagnosis Date   Anxiety    Bacterial vaginosis    Blood dyscrasia    protein s deficiency   Endometrial polyp 2012   Fibroids 2008   Hypercholesteremia    Hypertension    checks bp at home - 110/70   Intermenstrual bleeding 2008   Lymphedema of left leg    post traumatic   Menorrhagia 2012   Pre-eclampsia, severe    delivered stillbirth   Pregnancy induced hypertension    Preterm labor    Protein S deficiency ([place])    Took lovenox with twin pregnancy   Trichomonas vaginitis 2012   Vitamin D deficiency 2012    PAST SURGICAL HISTORY: Past Surgical History:  Procedure Laterality Date   ANKLE SURGERY  2000, 1999   BILATERAL  SALPINGECTOMY Bilateral 09/29/2014   Procedure: BILATERAL SALPINGECTOMY;  Surgeon: Delsa Bern, MD;  Location: [facility];  Service: Gynecology;  Laterality: Bilateral;   CESAREAN SECTION  2005, 2003   LAPAROSCOPIC TUBAL LIGATION  04/09/2011   Procedure: LAPAROSCOPIC TUBAL LIGATION;  Surgeon: Alwyn Pea, MD;  Location: [name];  Service: Gynecology;  Laterality: Bilateral;   NOVASURE ABLATION  04/09/2011   Procedure: NOVASURE ABLATION;  Surgeon: Alwyn Pea, MD;  Location: [facility];  Service: Gynecology;  Laterality: N/A;   ROBOTIC ASSISTED TOTAL HYSTERECTOMY N/A 09/29/2014   Procedure: ROBOTIC ASSISTED TOTAL HYSTERECTOMY ;  Surgeon: Delsa Bern, MD;  Location: [facility];  Service: Gynecology;  Laterality: N/A;   WISDOM TOOTH EXTRACTION      SOCIAL HISTORY: Social History   Tobacco Use   Smoking status: Never Smoker   Smokeless tobacco: Never Used  Substance Use Topics   Alcohol use: Yes    Alcohol/week: 1.0 standard drinks    Types: 1 Glasses of wine per week   Drug use: Lauren    FAMILY HISTORY: Family History  Problem Relation Age of Onset   Hyperlipidemia Mother    Hypertension Mother    Anesthesia  problems Neg Hx    Hypotension Neg Hx    Malignant hyperthermia Neg Hx    Pseudochol deficiency Neg Hx    ROS: Review of Systems  Constitutional: Positive for malaise/fatigue.  Cardiovascular: Negative for chest pain.  Gastrointestinal: Negative for nausea and vomiting.  Musculoskeletal:       Negative for muscle weakness.  Endo/Heme/[name]:       Positive for polyphagia.   PHYSICAL EXAM: Blood pressure (!) 150/91, pulse 67, temperature 98.4 F (36.9 C), temperature source Oral, height 5\' 6"  (1.676 m), weight 252 lb (114.3 kg), SpO2 99 %. Body mass index is 40.67 kg/m. Physical Exam Vitals signs reviewed.  Constitutional:      Appearance: Normal appearance. She is obese.  Cardiovascular:     Rate and Rhythm: Normal rate.     Pulses: Normal pulses.    Pulmonary:     Effort: Pulmonary effort is normal.     Breath sounds: Normal breath sounds.  Musculoskeletal: Normal range of motion.  Skin:    General: Skin is warm and dry.  Neurological:     Mental Status: She is alert and oriented to person, place, and time.  Psychiatric:        Behavior: Behavior normal.   RECENT LABS AND TESTS: BMET    Component Value Date/Time   NA 138 04/28/2019 0919   K 4.3 04/28/2019 0919   CL 103 04/28/2019 0919   CO2 22 04/28/2019 0919   GLUCOSE 82 04/28/2019 0919   GLUCOSE 119 (H) 08/15/2018 1509   BUN 14 04/28/2019 0919   CREATININE 0.83 04/28/2019 0919   CREATININE 1.00 08/15/2018 1509   CALCIUM 9.2 04/28/2019 0919   GFRNONAA 87 04/28/2019 0919   GFRAA 100 04/28/2019 0919   Lab Results  Component Value Date   HGBA1C 5.3 04/28/2019   Lab Results  Component Value Date   INSULIN 22.2 04/28/2019   CBC    Component Value Date/Time   WBC 9.0 04/28/2019 0919   WBC 10.0 08/15/2018 1509   WBC 7.9 09/16/2014 1205   RBC 4.26 04/28/2019 0919   RBC 4.57 08/15/2018 1509   HGB 12.1 04/28/2019 0919   HCT 37.5 04/28/2019 0919   PLT 320 08/15/2018 1509   MCV 88 04/28/2019 0919   MCH 28.4 04/28/2019 0919   MCH 28.4 08/15/2018 1509   MCHC 32.3 04/28/2019 0919   MCHC 31.9 08/15/2018 1509   RDW 14.2 04/28/2019 0919   LYMPHSABS 2.0 04/28/2019 0919   MONOABS 0.4 08/15/2018 1509   EOSABS 0.1 04/28/2019 0919   BASOSABS 0.0 04/28/2019 0919   Iron/TIBC/Ferritin/ %Sat Lauren results found for: IRON, TIBC, FERRITIN, IRONPCTSAT Lipid Panel     Component Value Date/Time   CHOL 251 (H) 04/28/2019 0919   TRIG 137 04/28/2019 0919   HDL 54 04/28/2019 0919   LDLCALC 170 (H) 04/28/2019 0919   Hepatic Function Panel     Component Value Date/Time   PROT 7.2 04/28/2019 0919   ALBUMIN 4.5 04/28/2019 0919   AST 11 04/28/2019 0919   AST 21 08/15/2018 1509   ALT 21 04/28/2019 0919   ALT 31 08/15/2018 1509   ALKPHOS 100 04/28/2019 0919   BILITOT 0.3  04/28/2019 0919   BILITOT 0.7 08/15/2018 1509      Component Value Date/Time   TSH 1.800 04/28/2019 0919   Results for ALYSHIA, KERNAN (MRN [id number]) as of 05/14/2019 08:57  Ref. Range 04/28/2019 09:19  Vitamin D, 25-Hydroxy Latest Ref Range: 30.0 - 100.0 ng/mL 24.2 (  L)   OBESITY BEHAVIORAL INTERVENTION VISIT  Today's visit was #2  Starting weight: 250 lbs Starting date: 04/28/2019 Today's weight: 252 lbs  Today's date: 05/12/2019 Total lbs lost to date: 0    05/12/2019  Height 5\' 6"  (1.676 m)  Weight 252 lb (114.3 kg)  BMI (Calculated) 40.69  BLOOD PRESSURE - SYSTOLIC 751  BLOOD PRESSURE - DIASTOLIC 91   Body Fat % 02.5 %  Total Body Water (lbs) 93.4 lbs   ASK: We discussed the diagnosis of obesity with Romeo Apple today and [name] agreed to give [place] permission to discuss obesity behavioral modification therapy today.  ASSESS: Mylin has the diagnosis of obesity and her BMI today is 40.8. Kasee is in the action stage of change.   ADVISE: Tyrena was educated on the multiple health risks of obesity as well as the benefit of weight loss to improve her health. She was advised of the need for long term treatment and the importance of lifestyle modifications to improve her current health and to decrease her risk of future health problems.  AGREE: Multiple dietary modification options and treatment options were discussed and  Betha agreed to follow the recommendations documented in the above note.  ARRANGE: Liany was educated on the importance of frequent visits to treat obesity as outlined per CMS and USPSTF guidelines and agreed to schedule her next follow up appointment today.  I, Michaelene Song, am acting as Location manager for Dennard Nip, MD  I have reviewed the above documentation for accuracy and completeness, and I agree with the above. -Dennard Nip, MD

## 2019-06-02 ENCOUNTER — Ambulatory Visit (INDEPENDENT_AMBULATORY_CARE_PROVIDER_SITE_OTHER): Payer: Managed Care, Other (non HMO) | Admitting: Family Medicine

## 2019-07-05 ENCOUNTER — Other Ambulatory Visit (INDEPENDENT_AMBULATORY_CARE_PROVIDER_SITE_OTHER): Payer: Self-pay | Admitting: Family Medicine

## 2019-07-05 DIAGNOSIS — E559 Vitamin D deficiency, unspecified: Secondary | ICD-10-CM

## 2019-08-18 ENCOUNTER — Other Ambulatory Visit: Payer: Self-pay

## 2019-08-18 DIAGNOSIS — Z139 Encounter for screening, unspecified: Secondary | ICD-10-CM

## 2019-08-19 LAB — NOVEL CORONAVIRUS, NAA: SARS-CoV-2, NAA: NOT DETECTED

## 2020-03-13 ENCOUNTER — Encounter: Payer: Self-pay | Admitting: Emergency Medicine

## 2020-03-13 ENCOUNTER — Ambulatory Visit
Admission: EM | Admit: 2020-03-13 | Discharge: 2020-03-13 | Disposition: A | Discharge status: Home / Self Care | Payer: Managed Care, Other (non HMO) | Attending: Emergency Medicine | Admitting: Emergency Medicine

## 2020-03-13 ENCOUNTER — Other Ambulatory Visit: Payer: Self-pay

## 2020-03-13 DIAGNOSIS — K644 Residual hemorrhoidal skin tags: Secondary | ICD-10-CM | POA: Diagnosis not present

## 2020-03-13 HISTORY — DX: Metabolic syndrome: E88.81

## 2020-03-13 HISTORY — DX: Insulin resistance, unspecified: E88.819

## 2020-03-13 MED ORDER — BENZOCAINE 20 % RE OINT: TOPICAL_OINTMENT | RECTAL | 0 refills | Status: DC | PRN

## 2020-03-13 MED ORDER — HYDROCORTISONE (PERIANAL) 2.5 % EX CREA: 1.0000 | TOPICAL_CREAM | Freq: Two times a day (BID) | CUTANEOUS | 0 refills | Status: DC

## 2020-03-13 NOTE — ED Triage Notes (Addendum)
Pt has a mass on anus x 3 months that continues to get bigger. Denies any pain but does report itching.  Pt has seen pcp and was told it was hemorrhoids.  Pt has used hemorrhoid cream, suppositories and rectal care with no relief.  Pt is worried that it is a genital wart or cancerous states she had warts come out when she was pregnant.

## 2020-03-13 NOTE — ED Notes (Signed)
Chaperoned provider during external rectal exam

## 2020-03-13 NOTE — ED Provider Notes (Signed)
[facility]   [id number] 03/13/20 Arrival Time: [phone]   Chief Complaint  Patient presents with  . Hemorrhoids     SUBJECTIVE: History from: patient.  Lauren [name] is a 44 y.o. female .  Who presents to the urgent care for complaint of anal mass that is getting bigger for the past 3 months.  States has seen PCP and was told she has hemorrhoids.  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  Patient is concerned about possibility of having anal wart.  Has tried hemorrhoid cream for wart without relief.  She also has tried any.  imiquid for genital wart without relief.  Patient is worried that it can develop into cancer.  Reports she has a recent colonoscopy that was negative.  Denies chills, fever, nausea, vomiting, diarrhea, change in bowel, bladder.    ROS: As per HPI.  All other pertinent ROS negative.     Past Medical History:  Diagnosis Date  . Anxiety   . Bacterial vaginosis   . Blood dyscrasia    protein s deficiency  . Endometrial polyp 2012  . Fibroids 2008  . Hypercholesteremia   . Hypertension    checks bp at home - 110/70  . Insulin resistance   . Intermenstrual bleeding 2008  . Lymphedema of left leg    post traumatic  . Menorrhagia 2012  . Pre-eclampsia, severe    delivered stillbirth  . Pregnancy induced hypertension   . Preterm labor   . Protein S deficiency ([place])    Took lovenox with twin pregnancy  . Trichomonas vaginitis 2012  . Vitamin D deficiency 2012   Past Surgical History:  Procedure Laterality Date  . ANKLE SURGERY  2000, 1999  . BILATERAL SALPINGECTOMY Bilateral 09/29/2014   Procedure: BILATERAL SALPINGECTOMY;  Surgeon: Delsa Bern, MD;  Location: [facility];  Service: Gynecology;  Laterality: Bilateral;  . CESAREAN SECTION  2005, 2003  . LAPAROSCOPIC TUBAL LIGATION  04/09/2011   Procedure: LAPAROSCOPIC TUBAL LIGATION;  Surgeon: Alwyn Pea, MD;  Location: [facility];  Service:  Gynecology;  Laterality: Bilateral;  . NOVASURE ABLATION  04/09/2011   Procedure: NOVASURE ABLATION;  Surgeon: Alwyn Pea, MD;  Location: [facility];  Service: Gynecology;  Laterality: N/A;  . ROBOTIC ASSISTED TOTAL HYSTERECTOMY N/A 09/29/2014   Procedure: ROBOTIC ASSISTED TOTAL HYSTERECTOMY ;  Surgeon: Delsa Bern, MD;  Location: [facility];  Service: Gynecology;  Laterality: N/A;  . WISDOM TOOTH EXTRACTION     No Known Allergies No current facility-administered medications on file prior to encounter.   Current Outpatient Medications on File Prior to Encounter  Medication Sig Dispense Refill  . ALPRAZolam (XANAX) 0.5 MG tablet Take 0.5 mg by mouth at bedtime as needed for anxiety.    Marland Kitchen lisinopril-hydrochlorothiazide (PRINZIDE,ZESTORETIC) 20-12.5 MG per tablet Take 1 tablet by mouth daily.      . simvastatin (ZOCOR) 40 MG tablet Take 40 mg by mouth daily at 6 PM.    . Vitamin D, Ergocalciferol, (DRISDOL) 1.25 MG (50000 UT) CAPS capsule Take 1 capsule (50,000 Units total) by mouth every 7 (seven) days. 4 capsule 0  . metFORMIN (GLUCOPHAGE) 500 MG tablet Take 1 tablet (500 mg total) by mouth daily with breakfast. 30 tablet 0   Social History   Socioeconomic History  . Marital status: Divorced    Spouse name: Not on file  . Number of children: 2  . Years of education:  Not on file  . Highest education level: Not on file  Occupational History  . Occupation: Phelebotomist  Tobacco Use  . Smoking status: Never Smoker  . Smokeless tobacco: Never Used  Vaping Use  . Vaping Use: Never used  Substance and Sexual Activity  . Alcohol use: Yes    Alcohol/week: 1.0 standard drink    Types: 1 Glasses of wine per week  . Drug use: No  . Sexual activity: Yes    Birth control/protection: Condom, Surgical    Comment: BTL, ablation  Other Topics Concern  . Not on file  Social History Narrative  . Not on file   Social Determinants of Health   Financial Resource Strain:   . Difficulty of Paying  Living Expenses:   Food Insecurity:   . Worried About Charity fundraiser in the Last Year:   . Arboriculturist in the Last Year:   Transportation Needs:   . Film/video editor (Medical):   Marland Kitchen Lack of Transportation (Non-Medical):   Physical Activity:   . Days of Exercise per Week:   . Minutes of Exercise per Session:   Stress:   . Feeling of Stress :   Social Connections:   . Frequency of Communication with Friends and Family:   . Frequency of Social Gatherings with Friends and Family:   . Attends Religious Services:   . Active Member of Clubs or Organizations:   . Attends Archivist Meetings:   Marland Kitchen Marital Status:   Intimate Partner Violence:   . Fear of Current or Ex-Partner:   . Emotionally Abused:   Marland Kitchen Physically Abused:   . Sexually Abused:    Family History  Problem Relation Age of Onset  . Hyperlipidemia Mother   . Hypertension Mother   . Anesthesia problems Neg Hx   . Hypotension Neg Hx   . Malignant hyperthermia Neg Hx   . Pseudochol deficiency Neg Hx     OBJECTIVE:  Vitals:   03/13/20 0850 03/13/20 0859  BP: 139/89   Pulse: 72   Resp: 18   Temp: 97.8 F (36.6 C)   TempSrc: Oral   SpO2: 96%   Weight:  232 lb (105.2 kg)  Height:  5\' 6"  (1.676 m)     Physical Exam Vitals and nursing note reviewed. Exam conducted with a chaperone present.  Constitutional:      General: She is not in acute distress.    Appearance: Normal appearance. She is normal weight. She is not ill-appearing, toxic-appearing or diaphoretic.  Cardiovascular:     Rate and Rhythm: Normal rate and regular rhythm.     Pulses: Normal pulses.     Heart sounds: Normal heart sounds. No murmur heard.  No friction rub. No gallop.   Pulmonary:     Effort: Pulmonary effort is normal. No respiratory distress.     Breath sounds: Normal breath sounds. No stridor. No wheezing, rhonchi or rales.  Chest:     Chest wall: No tenderness.  Genitourinary:      Comments: External  hemorrhoid present non erythematous Neurological:     Mental Status: She is alert.     LABS:  No results found for this or any previous visit (from the past 24 hour(s)).   ASSESSMENT & PLAN:  1. Hemorrhoids, external     Meds ordered this encounter  Medications  . hydrocortisone (ANUSOL-HC) 2.5 % rectal cream    Sig: Place 1 application rectally 2 (two) times daily.  Dispense:  30 g    Refill:  0  . benzocaine (AMERICAINE) 20 % rectal ointment    Sig: Place rectally every 3 (three) hours as needed for pain.    Dispense:  28.4 g    Refill:  0    Discharge Instructions Perform sitz water baths Eat a diet high if fiber and drink plenty of water Prescribed Anusol and benzocaine  use medication as prescribed for symptomatic relief Follow up with PCP if symptoms persists Return or go to the ER if you have any new or worsening symptoms    Reviewed expectations re: course of current medical issues. Questions answered. Outlined signs and symptoms indicating need for more acute intervention. Patient verbalized understanding. After Visit Summary given.      Note: This document was prepared using Dragon voice recognition software and may include unintentional dictation errors.    Emerson Monte, [place] 03/13/20 [phone]

## 2020-03-13 NOTE — Discharge Instructions (Addendum)
Perform sitz water baths Eat a diet high if fiber and drink plenty of water Prescribed Anusol and benzocaine Use medication as prescribed for symptomatic relief Follow up with PCP if symptoms persists Return or go to the ER if you have any new or worsening symptoms

## 2020-05-03 ENCOUNTER — Telehealth: Payer: Self-pay | Admitting: *Deleted

## 2020-05-03 NOTE — Telephone Encounter (Signed)
Message received from patient wanting to know if Dr. Marin Olp recommends she get the Covid-19 vaccine.  Dr. Marin Olp notified.  Call placed back to patient and patient notified that Dr. Marin Olp is recommending that his patient's get the [place] Covid-19 vaccine.  Pt appreciative of call back and has no further questions at this time.

## 2020-09-21 ENCOUNTER — Other Ambulatory Visit: Payer: Self-pay

## 2020-09-21 DIAGNOSIS — Z20822 Contact with and (suspected) exposure to covid-19: Secondary | ICD-10-CM

## 2020-09-23 LAB — NOVEL CORONAVIRUS, NAA: SARS-CoV-2, NAA: NOT DETECTED

## 2020-09-23 LAB — SARS-COV-2, NAA 2 DAY TAT

## 2021-12-27 ENCOUNTER — Ambulatory Visit: Payer: Managed Care, Other (non HMO) | Admitting: Podiatry

## 2021-12-27 DIAGNOSIS — Q666 Other congenital valgus deformities of feet: Secondary | ICD-10-CM | POA: Diagnosis not present

## 2021-12-27 DIAGNOSIS — M722 Plantar fascial fibromatosis: Secondary | ICD-10-CM | POA: Diagnosis not present

## 2022-01-04 ENCOUNTER — Encounter: Payer: Self-pay | Admitting: Podiatry

## 2022-01-04 NOTE — Progress Notes (Signed)
?Subjective:  ?Patient ID: Lauren [name], female    DOB: 1976-05-18,  MRN: [id number] ? ?Chief Complaint  ?Patient presents with  ? Foot Pain  ? ? ?46 y.o. female presents with the above complaint.  Patient presents with complaint bilateral Planter fasciitis right greater than left side.  She states it hurts with ambulation is progressive gotten worse.  She would like to discuss treatment options for it.  Hurts with taking for step in the morning.  She has not tried anything for it.  She has not seen anyone else prior to seeing me.  She would like to mostly do injection on the right side.  But she would like to discuss other treatment options for both feet.  She also does not wear any orthotics. ? ? ?Review of Systems: Negative except as noted in the HPI. Denies N/V/F/Ch. ? ?Past Medical History:  ?Diagnosis Date  ? Anxiety   ? Bacterial vaginosis   ? Blood dyscrasia   ? protein s deficiency  ? Endometrial polyp 2012  ? Fibroids 2008  ? Hypercholesteremia   ? Hypertension   ? checks bp at home - 110/70  ? Insulin resistance   ? Intermenstrual bleeding 2008  ? Lymphedema of left leg   ? post traumatic  ? Menorrhagia 2012  ? Pre-eclampsia, severe   ? delivered stillbirth  ? Pregnancy induced hypertension   ? Preterm labor   ? Protein S deficiency ([place])   ? Took lovenox with twin pregnancy  ? Trichomonas vaginitis 2012  ? Vitamin D deficiency 2012  ? ? ?Current Outpatient Medications:  ?  ALPRAZolam (XANAX) 0.5 MG tablet, Take 0.5 mg by mouth at bedtime as needed for anxiety., Disp: , Rfl:  ?  benzocaine (AMERICAINE) 20 % rectal ointment, Place rectally every 3 (three) hours as needed for pain., Disp: 28.4 g, Rfl: 0 ?  hydrocortisone (ANUSOL-HC) 2.5 % rectal cream, Place 1 application rectally 2 (two) times daily., Disp: 30 g, Rfl: 0 ?  lisinopril-hydrochlorothiazide (PRINZIDE,ZESTORETIC) 20-12.5 MG per tablet, Take 1 tablet by mouth daily.  , Disp: , Rfl:  ?  metFORMIN (GLUCOPHAGE) 500 MG tablet, Take 1 tablet (500  mg total) by mouth daily with breakfast., Disp: 30 tablet, Rfl: 0 ?  simvastatin (ZOCOR) 40 MG tablet, Take 40 mg by mouth daily at 6 PM., Disp: , Rfl:  ?  Vitamin D, Ergocalciferol, (DRISDOL) 1.25 MG (50000 UT) CAPS capsule, Take 1 capsule (50,000 Units total) by mouth every 7 (seven) days., Disp: 4 capsule, Rfl: 0 ? ?Social History  ? ?Tobacco Use  ?Smoking Status Never  ?Smokeless Tobacco Never  ? ? ?No Known Allergies ?Objective:  ?There were no vitals filed for this visit. ?There is no height or weight on file to calculate BMI. ?Constitutional Well developed. ?Well nourished.  ?Vascular Dorsalis pedis pulses palpable bilaterally. ?Posterior tibial pulses palpable bilaterally. ?Capillary refill normal to all digits.  ?No cyanosis or clubbing noted. ?Pedal hair growth normal.  ?Neurologic Normal speech. ?Oriented to person, place, and time. ?Epicritic sensation to light touch grossly present bilaterally.  ?Dermatologic Nails well groomed and normal in appearance. ?No open wounds. ?No skin lesions.  ?Orthopedic: Normal joint ROM without pain or crepitus bilaterally. ?No visible deformities. ?Tender to palpation at the calcaneal tuber bilaterally. ?No pain with calcaneal squeeze bilaterally. ?Ankle ROM diminished range of motion bilaterally. ?Silfverskiold Test: positive bilaterally.  ? ?Radiographs: None ? ?Assessment:  ? ?1. Pes planovalgus   ?2. Plantar fasciitis of right foot   ?  3. Plantar fasciitis of left foot   ? ?Plan:  ?Patient was evaluated and treated and all questions answered. ? ?Plantar Fasciitis, bilaterally right greater than left ?- XR reviewed as above.  ?- Educated on icing and stretching. Instructions given.  ?- Injection delivered to the plantar fascia as below only on the right side ?- DME: Plantar fascial brace dispensed to support the medial longitudinal arch of the foot and offload pressure from the heel and prevent arch collapse during weightbearing ?- Pharmacologic management: None ? ?Pes  planovalgus ?-I explained to patient the etiology of pes planovalgus and relationship with Planter fasciitis and various treatment options were discussed.  Given patient foot structure in the setting of Planter fasciitis I believe patient will benefit from custom-made orthotics to help control the hindfoot motion support the arch of the foot and take the stress away from plantar fascial.  Patient agrees with the plan like to proceed with orthotics ?-Patient was casted for orthotics ? ? ?Procedure: Injection Tendon/Ligament ?Location: Right plantar fascia at the glabrous junction; medial approach. ?Skin Prep: alcohol ?Injectate: 0.5 cc 0.5% marcaine plain, 0.5 cc of 1% Lidocaine, 0.5 cc kenalog 10. ?Disposition: Patient tolerated procedure well. Injection site dressed with a band-aid. ? ?No follow-ups on file. ?

## 2022-01-18 ENCOUNTER — Telehealth: Payer: Self-pay

## 2022-01-18 NOTE — Telephone Encounter (Signed)
Foot Orthotics Ready - Called and left voicemail to schedule pickup ?

## 2022-01-31 ENCOUNTER — Ambulatory Visit: Payer: Managed Care, Other (non HMO) | Admitting: Podiatry

## 2022-01-31 ENCOUNTER — Ambulatory Visit: Payer: Managed Care, Other (non HMO)

## 2022-01-31 DIAGNOSIS — M722 Plantar fascial fibromatosis: Secondary | ICD-10-CM

## 2022-01-31 DIAGNOSIS — Q666 Other congenital valgus deformities of feet: Secondary | ICD-10-CM

## 2022-01-31 NOTE — Progress Notes (Signed)
SITUATION: ?Reason for Visit: Fitting and Delivery of Custom Fabricated Foot Orthoses ?Patient Report: Patient reports comfort and is satisfied with device. ? ?OBJECTIVE DATA: ?Patient History / Diagnosis:   ?  ICD-10-CM   ?1. Pes planovalgus  Q66.6   ?  ?2. Plantar fasciitis of right foot  M72.2   ?  ?3. Plantar fasciitis of left foot  M72.2   ?  ? ? ?Provided Device:  Custom Functional Foot Orthotics ?    [employer]: [id number] ? ?GOAL OF ORTHOSIS ?- Improve gait ?- Decrease energy expenditure ?- Improve Balance ?- Provide Triplanar stability of foot complex ?- Facilitate motion ? ?ACTIONS PERFORMED ?Patient was fit with foot orthotics trimmed to shoe last. Patient tolerated fittign procedure.  ? ?Patient was provided with verbal and written instruction and demonstration regarding donning, doffing, wear, care, proper fit, function, purpose, cleaning, and use of the orthosis and in all related precautions and risks and benefits regarding the orthosis. ? ?Patient was also provided with verbal instruction regarding how to report any failures or malfunctions of the orthosis and necessary follow up care. Patient was also instructed to contact our office regarding any change in status that may affect the function of the orthosis. ? ?Patient demonstrated independence with proper donning, doffing, and fit and verbalized understanding of all instructions. ? ?PLAN: ?Patient is to follow up in one week or as necessary (PRN). All questions were answered and concerns addressed. Plan of care was discussed with and agreed upon by the patient. ? ?

## 2022-03-02 ENCOUNTER — Ambulatory Visit: Payer: Managed Care, Other (non HMO) | Admitting: Podiatry

## 2022-03-02 ENCOUNTER — Ambulatory Visit: Payer: Managed Care, Other (non HMO)

## 2022-03-02 DIAGNOSIS — M722 Plantar fascial fibromatosis: Secondary | ICD-10-CM

## 2022-03-02 DIAGNOSIS — Q666 Other congenital valgus deformities of feet: Secondary | ICD-10-CM

## 2022-03-02 NOTE — Progress Notes (Signed)
SITUATION Reason for Consult: Follow-up with orthotics Patient / Caregiver Report: Patient reports right arch hurts  OBJECTIVE DATA History / Diagnosis:    ICD-10-CM   1. Pes planovalgus  Q66.6     2. Plantar fasciitis of right foot  M72.2     3. Plantar fasciitis of left foot  M72.2       Change in Pathology: None  ACTIONS PERFORMED Patient's equipment was checked for structural stability and fit. Heated and lowered arch Device(s) intact and fit is excellent. All questions answered and concerns addressed.  PLAN Follow-up as needed (PRN). Plan of care discussed with and agreed upon by patient / caregiver.

## 2022-05-02 ENCOUNTER — Encounter (INDEPENDENT_AMBULATORY_CARE_PROVIDER_SITE_OTHER): Payer: Self-pay

## 2023-01-22 ENCOUNTER — Encounter: Payer: Self-pay | Admitting: Emergency Medicine

## 2023-01-22 ENCOUNTER — Ambulatory Visit
Admission: EM | Admit: 2023-01-22 | Discharge: 2023-01-22 | Disposition: A | Discharge status: Home / Self Care | Payer: Managed Care, Other (non HMO) | Attending: Physician Assistant | Admitting: Physician Assistant

## 2023-01-22 ENCOUNTER — Other Ambulatory Visit: Payer: Self-pay

## 2023-01-22 DIAGNOSIS — B349 Viral infection, unspecified: Secondary | ICD-10-CM

## 2023-01-22 DIAGNOSIS — J029 Acute pharyngitis, unspecified: Secondary | ICD-10-CM | POA: Diagnosis not present

## 2023-01-22 LAB — POCT MONO SCREEN (KUC): Mono, POC: NEGATIVE

## 2023-01-22 NOTE — ED Triage Notes (Addendum)
Patient went to [facility] ucc.  Patient instructed to follow up with pcp.  Unable to, pcp is out sick.    Patient reports multiple tests at ucc.  But nothing positive on tests.   Patient has a cough and sore throat, thick yellow phlegm, swollen lymph nodes, bilateral ear pain.    Patient has had phone call to  [facility] clinic.  Patient has multiple concerns related to cough, neck soreness   Patient is taking amoxicillin 500 mg    Patient is out of blood pressure medicine

## 2023-01-22 NOTE — Discharge Instructions (Signed)
Finish antibiotic.  Return for recheck if not improving in 3-4 days

## 2023-01-24 NOTE — ED Provider Notes (Signed)
[facility]    CSN: [id number] Arrival date & time: 01/22/23  1832      History   Chief Complaint No chief complaint on file.   HPI Lauren [name] is a 47 y.o. female.   Patient complains of a sore throat.  Patient was seen at the urgent care in [place].  Patient had a negative strep screen negative flu negative COVID.  Patient reports that she was told that she had a viral illness.  Patient reports that she was exposed to a family member who had adenovirus.  Patient is concerned that she has adenovirus.  Patient is also concerned that she could have mono.  The history is provided by the patient. No language interpreter was used.    Past Medical History:  Diagnosis Date   Anxiety    Bacterial vaginosis    Blood dyscrasia    protein s deficiency   Endometrial polyp 2012   Fibroids 2008   Hypercholesteremia    Hypertension    checks bp at home - 110/70   Insulin resistance    Intermenstrual bleeding 2008   Lymphedema of left leg    post traumatic   Menorrhagia 2012   Pre-eclampsia, severe    delivered stillbirth   Pregnancy induced hypertension    Preterm labor    Protein S deficiency ([facility])    Took lovenox with twin pregnancy   Trichomonas vaginitis 2012   Vitamin D deficiency 2012    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   Irregular bleeding 09/29/2014    Past Surgical History:  Procedure Laterality Date   ANKLE SURGERY  2000, 1999   BILATERAL SALPINGECTOMY Bilateral 09/29/2014   Procedure: BILATERAL SALPINGECTOMY;  Surgeon: Silverio Lay, MD;  Location: [facility];  Service: Gynecology;  Laterality: Bilateral;   CESAREAN SECTION  2005, 2003   LAPAROSCOPIC TUBAL LIGATION  04/09/2011   Procedure: LAPAROSCOPIC TUBAL  LIGATION;  Surgeon: Esmeralda Arthur, MD;  Location: [facility];  Service: Gynecology;  Laterality: Bilateral;   NOVASURE ABLATION  04/09/2011   Procedure: NOVASURE ABLATION;  Surgeon: Esmeralda Arthur, MD;  Location: [facility];  Service: Gynecology;  Laterality: N/A;   ROBOTIC ASSISTED TOTAL HYSTERECTOMY N/A 09/29/2014   Procedure: ROBOTIC ASSISTED TOTAL HYSTERECTOMY ;  Surgeon: Silverio Lay, MD;  Location: [facility];  Service: Gynecology;  Laterality: N/A;   WISDOM TOOTH EXTRACTION      OB History     Gravida  3   Para  2   Term      Preterm  2   AB  1   Living  2      SAB      IAB  1   Ectopic      Multiple  1   Live Births  2            Home Medications    Prior to Admission medications   Medication Sig Start Date End Date Taking? Authorizing Provider  ALPRAZolam Prudy Feeler) 0.5 MG tablet Take 0.5 mg by mouth at bedtime as needed for anxiety.    [provider]  benzocaine (AMERICAINE) 20 % rectal ointment Place rectally every 3 (three) hours as needed for pain. 03/13/20   Avegno, Zachery Dakins, FNP  hydrocortisone (ANUSOL-HC) 2.5 %  rectal cream Place 1 application rectally 2 (two) times daily. 03/13/20   Avegno, Zachery Dakins, FNP  lisinopril-hydrochlorothiazide (PRINZIDE,ZESTORETIC) 20-12.5 MG per tablet Take 1 tablet by mouth daily.      [provider]  metFORMIN (GLUCOPHAGE) 500 MG tablet Take 1 tablet (500 mg total) by mouth daily with breakfast. Patient not taking: Reported on 01/22/2023 05/12/19   Quillian Quince D, MD  simvastatin (ZOCOR) 40 MG tablet Take 40 mg by mouth daily at 6 PM.    [provider]  Vitamin D, Ergocalciferol, (DRISDOL) 1.25 MG (50000 UT) CAPS capsule Take 1 capsule (50,000 Units total) by mouth every 7 (seven) days. 05/12/19   Wilder Glade, MD    Family History Family History  Problem Relation Age of Onset   Hyperlipidemia Mother    Hypertension Mother    Anesthesia problems Neg Hx    Hypotension Neg Hx    Malignant  hyperthermia Neg Hx    Pseudochol deficiency Neg Hx     Social History Social History   Tobacco Use   Smoking status: Never   Smokeless tobacco: Never  Vaping Use   Vaping Use: Never used  Substance Use Topics   Alcohol use: Yes    Alcohol/week: 1.0 standard drink of alcohol    Types: 1 Glasses of wine per week   Drug use: No     Allergies   Patient has no known allergies.   Review of Systems Review of Systems  All other systems reviewed and are negative.    Physical Exam Triage Vital Signs ED Triage Vitals  Enc Vitals Group     BP 01/22/23 1908 (!) 167/102     Pulse Rate 01/22/23 1908 74     Resp 01/22/23 1908 (!) 22     Temp 01/22/23 1908 98 F (36.7 C)     Temp Source 01/22/23 1908 Oral     SpO2 01/22/23 1908 98 %     Weight --      Height --      Head Circumference --      Peak Flow --      Pain Score 01/22/23 1905 8     Pain Loc --      Pain Edu? --      Excl. in GC? --    No data found.  Updated Vital Signs BP (!) 167/102 (BP Location: Right Arm) Comment (BP Location): large cuff  Pulse 74   Temp 98 F (36.7 C) (Oral)   Resp (!) 22   LMP  (LMP Unknown)   SpO2 98%   Visual Acuity Right Eye Distance:   Left Eye Distance:   Bilateral Distance:    Right Eye Near:   Left Eye Near:    Bilateral Near:     Physical Exam Vitals and nursing note reviewed.  Constitutional:      Appearance: She is well-developed.  HENT:     Head: Normocephalic.     Mouth/Throat:     Comments: Mild erythema throat Cardiovascular:     Rate and Rhythm: Normal rate.  Pulmonary:     Effort: Pulmonary effort is normal.  Abdominal:     General: There is no distension.  Musculoskeletal:        General: Normal range of motion.     Cervical back: Normal range of motion.  Skin:    General: Skin is warm.  Neurological:     General: No focal deficit present.     Mental Status:  She is alert and oriented to person, place, and time.      UC Treatments /  Results  Labs (all labs ordered are listed, but only abnormal results are displayed) Labs Reviewed  POCT MONO SCREEN [facility])    EKG   Radiology No results found.  Procedures Procedures (including critical care time)  Medications Ordered in UC Medications - No data to display  Initial Impression / Assessment and Plan / UC Course  I have reviewed the triage vital signs and the nursing notes.  Pertinent labs & imaging results that were available during my care of the patient were reviewed by me and considered in my medical decision making (see chart for details).     MDM: Monotest is negative.  Patient describes an illness with cough eyes being injected nausea and vomiting.  This sounds very much like a viral illness it very well could have been or is adenovirus.  Patient states that she has been improving since starting to take amoxicillin I have advised her that she can finish taking the amoxicillin I do not feel like she needs further treatment at this time. Final Clinical Impressions(s) / UC Diagnoses   Final diagnoses:  Acute pharyngitis, unspecified etiology  Viral illness     Discharge Instructions      Finish antibiotic.  Return for recheck if not improving in 3-4 days    ED Prescriptions   None    PDMP not reviewed this encounter. An After Visit Summary was printed and given to the patient.       Elson Areas, [place] 01/24/23 1301

## 2023-09-03 ENCOUNTER — Ambulatory Visit (INDEPENDENT_AMBULATORY_CARE_PROVIDER_SITE_OTHER): Payer: Managed Care, Other (non HMO) | Admitting: Student

## 2023-09-03 ENCOUNTER — Ambulatory Visit (HOSPITAL_BASED_OUTPATIENT_CLINIC_OR_DEPARTMENT_OTHER): Payer: Managed Care, Other (non HMO)

## 2023-09-03 ENCOUNTER — Other Ambulatory Visit (HOSPITAL_BASED_OUTPATIENT_CLINIC_OR_DEPARTMENT_OTHER): Payer: Self-pay

## 2023-09-03 ENCOUNTER — Encounter (HOSPITAL_BASED_OUTPATIENT_CLINIC_OR_DEPARTMENT_OTHER): Payer: Self-pay | Admitting: Student

## 2023-09-03 DIAGNOSIS — M2142 Flat foot [pes planus] (acquired), left foot: Secondary | ICD-10-CM

## 2023-09-03 DIAGNOSIS — M25571 Pain in right ankle and joints of right foot: Secondary | ICD-10-CM

## 2023-09-03 DIAGNOSIS — M2141 Flat foot [pes planus] (acquired), right foot: Secondary | ICD-10-CM

## 2023-09-03 DIAGNOSIS — M76821 Posterior tibial tendinitis, right leg: Secondary | ICD-10-CM | POA: Diagnosis not present

## 2023-09-03 MED ORDER — MELOXICAM 15 MG PO TABS
15.0000 mg | ORAL_TABLET | Freq: Every day | ORAL | 0 refills | Status: AC
Start: 2023-09-03 — End: 2023-09-17
  Filled 2023-09-03: qty 14, 14d supply, fill #0

## 2023-09-03 NOTE — Progress Notes (Signed)
Chief Complaint: Right foot and ankle pain     History of Present Illness:    Lauren [name] is a 47 y.o. female here today for evaluation of pain in her right foot and ankle.  She has significant history of bilateral flatfoot deformities and has been seen by podiatry for plantar fasciitis of her right foot.  She has previously received plantar fascia cortisone injection.  States that the pain in her ankle began around 4 months ago without injury.  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  She worked a 10-hour shift yesterday and as of last night she has been unable to weight-bear due to pain.  She complains of pain and swelling located on the medial foot.  Has tried ice, elevation, Tylenol, Aleve and Epsom salts.  She has been given a custom orthotic for her right foot by podiatry which she does wear in one pair of her shoes.  She does utilize supportive footwear while at work.   Surgical History:   Prior left ankle fixation -around 2000  PMH/PSH/Family History/Social History/Meds/Allergies:    Past Medical History:  Diagnosis Date   Anxiety    Bacterial vaginosis    Blood dyscrasia    protein s deficiency   Endometrial polyp 2012   Fibroids 2008   Hypercholesteremia    Hypertension    checks bp at home - 110/70   Insulin resistance    Intermenstrual bleeding 2008   Lymphedema of left leg    post traumatic   Menorrhagia 2012   Pre-eclampsia, severe    delivered stillbirth   Pregnancy induced hypertension    Preterm labor    Protein S deficiency ([facility])    Took lovenox with twin pregnancy   Trichomonas vaginitis 2012   Vitamin D deficiency 2012   Past Surgical History:  Procedure Laterality Date   ANKLE SURGERY  2000, 1999   BILATERAL SALPINGECTOMY Bilateral 09/29/2014   Procedure: BILATERAL SALPINGECTOMY;  Surgeon: Silverio Lay, MD;  Location: [facility];  Service: Gynecology;  Laterality:  Bilateral;   CESAREAN SECTION  2005, 2003   LAPAROSCOPIC TUBAL LIGATION  04/09/2011   Procedure: LAPAROSCOPIC TUBAL LIGATION;  Surgeon: Esmeralda Arthur, MD;  Location: [facility];  Service: Gynecology;  Laterality: Bilateral;   NOVASURE ABLATION  04/09/2011   Procedure: NOVASURE ABLATION;  Surgeon: Esmeralda Arthur, MD;  Location: [facility];  Service: Gynecology;  Laterality: N/A;   ROBOTIC ASSISTED TOTAL HYSTERECTOMY N/A 09/29/2014   Procedure: ROBOTIC ASSISTED TOTAL HYSTERECTOMY ;  Surgeon: Silverio Lay, MD;  Location: [facility];  Service: Gynecology;  Laterality: N/A;   WISDOM TOOTH EXTRACTION     Social History   Socioeconomic History   Marital status: Divorced    Spouse name: Not on file   Number of children: 2   Years of education: Not on file   Highest education level: Not on file  Occupational History   Occupation: Phelebotomist  Tobacco Use   Smoking status: Never   Smokeless tobacco: Never  Vaping Use   Vaping status: Never Used  Substance and Sexual Activity   Alcohol use: Yes    Alcohol/week: 1.0 standard drink of alcohol    Types: 1 Glasses of wine per week   Drug use: No  Sexual activity: Yes    Birth control/protection: Condom, Surgical    Comment: BTL, ablation  Other Topics Concern   Not on file  Social History Narrative   Not on file   Social Determinants of Health   Financial Resource Strain: Not on file  Food Insecurity: Not on file  Transportation Needs: Not on file  Physical Activity: Not on file  Stress: Not on file  Social Connections: Not on file   Family History  Problem Relation Age of Onset   Hyperlipidemia Mother    Hypertension Mother    Anesthesia problems Neg Hx    Hypotension Neg Hx    Malignant hyperthermia Neg Hx    Pseudochol deficiency Neg Hx    No Known Allergies Current Outpatient Medications  Medication Sig Dispense Refill   meloxicam (MOBIC) 15 MG tablet Take 1 tablet (15 mg total) by mouth daily for 14 days. 14 tablet 0    ALPRAZolam (XANAX) 0.5 MG tablet Take 0.5 mg by mouth at bedtime as needed for anxiety.     benzocaine (AMERICAINE) 20 % rectal ointment Place rectally every 3 (three) hours as needed for pain. 28.4 g 0   hydrocortisone (ANUSOL-HC) 2.5 % rectal cream Place 1 application rectally 2 (two) times daily. 30 g 0   lisinopril-hydrochlorothiazide (PRINZIDE,ZESTORETIC) 20-12.5 MG per tablet Take 1 tablet by mouth daily.       metFORMIN (GLUCOPHAGE) 500 MG tablet Take 1 tablet (500 mg total) by mouth daily with breakfast. (Patient not taking: Reported on 01/22/2023) 30 tablet 0   simvastatin (ZOCOR) 40 MG tablet Take 40 mg by mouth daily at 6 PM.     Vitamin D, Ergocalciferol, (DRISDOL) 1.25 MG (50000 UT) CAPS capsule Take 1 capsule (50,000 Units total) by mouth every 7 (seven) days. 4 capsule 0   No current facility-administered medications for this visit.   No results found.  Review of Systems:   A ROS was performed including pertinent positives and negatives as documented in the HPI.  Physical Exam :   Constitutional: NAD and appears stated age Neurological: Alert and oriented Psych: Appropriate affect and cooperative There were no vitals taken for this visit.   Comprehensive Musculoskeletal Exam:    Patient is here today nonweightbearing with aid of crutches.  Flatfoot deformities noted bilaterally.  Moderate swelling and pinpoint tenderness over the medial right foot surrounding the area of the PT tendon insertion.  No overlying erythema or ecchymosis.  Ankle motion slightly restricted particularly with active dorsiflexion.  Dorsalis pedis pulse 2+.  Distal sensation intact.  Imaging:   Xray (right ankle 3 views): Negative for acute bony abnormality.  Calcaneal spurring noted.  Medial soft tissue swelling.   I personally reviewed and interpreted the radiographs.   Assessment:   47 y.o. female with 50-month history of right foot pain that has recently become worse.  She is currently unable  to weight-bear due to pain.  Symptoms are located on the medial foot and do appear consistent with PT tendinitis particularly given her flatfeet.  At this point she has trialed many conservative therapies including RICE, NSAIDs, and use of orthotics and supportive footwear.  I do think she may be a good candidate for shockwave therapy with Dr. Shon Baton so I will plan to send referral today.  He can also evaluate orthotics as she does not have those with her today.  I will start her on a course of meloxicam to help ease pain and inflammation.  Plan :    -  Start meloxicam 15 mg -Referral to Dr. Shon Baton for further evaluation and possible shockwave therapy     I personally saw and evaluated the patient, and participated in the management and treatment plan.  Hazle Nordmann, PA-C Orthopedics

## 2023-09-04 ENCOUNTER — Telehealth (HOSPITAL_BASED_OUTPATIENT_CLINIC_OR_DEPARTMENT_OTHER): Payer: Self-pay

## 2023-09-04 NOTE — Telephone Encounter (Signed)
Pt dropped off FMLA paperwork at our [place] office. I called her and advised her she would need to pay $25 dollar fee. She was ok with this but asked it be done over the phone. I will scan the paperwork over for this to your email

## 2023-09-05 ENCOUNTER — Telehealth: Payer: Self-pay | Admitting: Student

## 2023-09-05 NOTE — Telephone Encounter (Signed)
Forms received. To [employer]

## 2023-09-05 NOTE — Telephone Encounter (Signed)
[employer] forms received. To [employer].

## 2023-09-06 ENCOUNTER — Telehealth: Payer: Self-pay | Admitting: Student

## 2023-09-06 NOTE — Telephone Encounter (Signed)
Received auth & $25 cash. To [employer].

## 2023-09-23 ENCOUNTER — Encounter: Payer: Self-pay | Admitting: Sports Medicine

## 2023-09-23 ENCOUNTER — Ambulatory Visit: Payer: Managed Care, Other (non HMO) | Admitting: Sports Medicine

## 2023-09-23 DIAGNOSIS — M25571 Pain in right ankle and joints of right foot: Secondary | ICD-10-CM | POA: Diagnosis not present

## 2023-09-23 DIAGNOSIS — Q666 Other congenital valgus deformities of feet: Secondary | ICD-10-CM | POA: Diagnosis not present

## 2023-09-23 DIAGNOSIS — M76821 Posterior tibial tendinitis, right leg: Secondary | ICD-10-CM | POA: Diagnosis not present

## 2023-09-23 NOTE — Progress Notes (Signed)
Lauren [name] - 47 y.o. female MRN [id number]  Date of birth: 07-May-1976  Office Visit Note: Visit Date: 09/23/2023 PCP: Coralee Rud, PA-C Referred by: Amador Cunas, PA*  Subjective: Chief Complaint  Patient presents with   Right Ankle - Pain   HPI: Lauren [name] is a pleasant 47 y.o. female who presents today for evaluation of acute on chronic right ankle pain and pes planus.  She was seen with over [place] [facility] by Hazle Nordmann.  He did place her on meloxicam 15 mg daily and sent her over here for possible evaluation of shockwave therapy and further treatment. [name] has pain over the medial aspect of the heel and in the arch of the foot, comes proximally to the inside of the medial malleolus.  She has a significant history of bilateral flatfoot deformity which has given her issues on her feet for the last 30 years or so, however her pain has worsened and been more bothersome over the last 1.5 years. She does have orthotics but did not bring in today.  Previous treatment: -Custom orthotics, does not feel these were super helpful -Plantar fascia injection: Not helpful.  She has had 1 other injections in the arch of the foot, unsure exactly how much this was beneficial. -Podiatry had placed her in a cast short-term --> transition to Boot (this was most helpful), but then pain returned -She now rotates in good shoewear and orthotics (somewhat helpful)  *She has never had an MRI in the past to evaluate  Pertinent ROS were reviewed with the patient and found to be negative unless otherwise specified above in HPI.   Assessment & Plan: Visit Diagnoses:  1. Posterior tibial tendon dysfunction, right   2. Pes planovalgus   3. Pain in right ankle and joints of right foot    Plan: Impression is chronic right medial foot and arch pain with concomitant right posterior tibial dysfunction in the setting of severe pes planovalgus with flatfeet.  She has  underwent multiple treatment including orthotics, shoewear modification, injection therapy, cast/boot with only mild improvement in symptoms.  Through shared-decision making, we did proceed with a trial of extracorporeal shockwave therapy for the posterior tibial tendon, the medial foot arch and plantar fascia.  We discussed the nature of this augmenting tissue and tendon healing as well as pain reduction after a number of consecutive treatments.  We will plan for 3 treatments consecutively each about 1 week apart. After this third treatment, we will reevaluate what sort of cumulative benefit she has going forward.  If she is at least 25% improved from this, we will plan for additional treatment summary between 5-7 treatments overall or until significant improvement.  At next visit, she will bring her orthotics and shoes with her as I would like to see her gait and ambulation with these to see how this corrects her pes planus and support her foot.   Additional workup considerations: MRI of the ankle/foot if not making improvement with above  Follow-up: Return in about 1 week (around 09/30/2023) for about 1-week (30-min for ST and gait/orthotic eval) for R-ankle.   Meds & Orders: No orders of the defined types were placed in this encounter.  No orders of the defined types were placed in this encounter.   Procedures:  Procedure: ECSWT Indications:  Medial arch pain, PT-tendon dysfunction   Procedure Details Consent: Risks of procedure as well as the alternatives and risks of each were explained to the  patient.  Verbal consent for procedure obtained. Time Out: Verified patient identification, verified procedure, site was marked, verified correct patient position. The area was cleaned with alcohol swab.     The Right medial foot arch/plantar fascia and PT-tendon was targeted for Extracorporeal shockwave therapy.    Preset: Plantar fasciitis/Tendonitis Power Level: 70 mJ Frequency: 10  Hz Impulse/cycles: 2500 Head size: Regular   Patient tolerated procedure well without immediate complications.         Clinical History: No specialty comments available.  She reports that she has never smoked. She has never used smokeless tobacco. No results for input(s): "HGBA1C", "LABURIC" in the last 8760 hours.  Objective:   Vital Signs: LMP  (LMP Unknown)   Physical Exam  Gen: Well-appearing, in no acute distress; non-toxic CV: Well-perfused. Warm.  Resp: Breathing unlabored on room air; no wheezing. Psych: Fluid speech in conversation; appropriate affect; normal thought process  Ortho Exam - Right foot/ankle: + TTP over the medial arch of the foot at the mid aspect of the plantar fascia as well as the course of the posterior tibial tendon just posterior to the medial malleolus into the arch of the foot.  There is a degree of posterior tibial tendon insufficiency on the right with bilateral heel raise.  *Bilateral feet/gait: There is severe pes planus with pes planovalgus and a degree of hindfoot valgus.  The patient does fall into pronation upon stance into her walking gait.  Slightly antalgic gait offloading the right.  Imaging:  *3 views of the right ankle including AP, lateral and oblique film were independently reviewed and interpreted by myself today.  X-rays demonstrate an intact ankle mortise.  There is notable loss of longitudinal arch.  There is a small plantar calcaneal spur and small enthesophyte off the superior calcaneus.  There is likely some soft tissue swelling over the medial ankle.  No acute bony fracture otherwise bony abnormality noted.  DG Ankle Complete Right CLINICAL DATA:  Right ankle pain and swelling for 2 days. No known injury.  EXAM: RIGHT ANKLE - COMPLETE 3+ VIEW  COMPARISON:  None Available.  FINDINGS: Normal bone mineralization. The ankle mortise is symmetric and intact. Small plantar and posterior calcaneal heel spurs. No acute fracture  dislocation. Large body habitus with subcutaneous edema.  IMPRESSION: 1. Small plantar and posterior calcaneal heel spurs. 2. Large body habitus with subcutaneous edema.  Electronically Signed   By: Neita Garnet M.D.   On: 09/10/2023 19:11    Past Medical/Family/Surgical/Social History: Medications & Allergies reviewed per EMR, new medications updated. 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   Irregular bleeding 09/29/2014   Past Medical History:  Diagnosis Date   Anxiety    Bacterial vaginosis    Blood dyscrasia    protein s deficiency   Endometrial polyp 2012   Fibroids 2008   Hypercholesteremia    Hypertension    checks bp at home - 110/70   Insulin resistance    Intermenstrual bleeding 2008   Lymphedema of left leg    post traumatic   Menorrhagia 2012   Pre-eclampsia, severe    delivered stillbirth   Pregnancy induced hypertension    Preterm labor    Protein S deficiency (HCC)    Took lovenox with twin pregnancy  Trichomonas vaginitis 2012   Vitamin D deficiency 2012   Family History  Problem Relation Age of Onset   Hyperlipidemia Mother    Hypertension Mother    Anesthesia problems Neg Hx    Hypotension Neg Hx    Malignant hyperthermia Neg Hx    Pseudochol deficiency Neg Hx    Past Surgical History:  Procedure Laterality Date   ANKLE SURGERY  2000, 1999   BILATERAL SALPINGECTOMY Bilateral 09/29/2014   Procedure: BILATERAL SALPINGECTOMY;  Surgeon: Silverio Lay, MD;  Location: [facility];  Service: Gynecology;  Laterality: Bilateral;   CESAREAN SECTION  2005, 2003   LAPAROSCOPIC TUBAL LIGATION  04/09/2011   Procedure: LAPAROSCOPIC TUBAL LIGATION;  Surgeon: Esmeralda Arthur, MD;  Location: [facility];  Service: Gynecology;   Laterality: Bilateral;   NOVASURE ABLATION  04/09/2011   Procedure: NOVASURE ABLATION;  Surgeon: Esmeralda Arthur, MD;  Location: [facility];  Service: Gynecology;  Laterality: N/A;   ROBOTIC ASSISTED TOTAL HYSTERECTOMY N/A 09/29/2014   Procedure: ROBOTIC ASSISTED TOTAL HYSTERECTOMY ;  Surgeon: Silverio Lay, MD;  Location: [facility];  Service: Gynecology;  Laterality: N/A;   WISDOM TOOTH EXTRACTION     Social History   Occupational History   Occupation: Phelebotomist  Tobacco Use   Smoking status: Never   Smokeless tobacco: Never  Vaping Use   Vaping status: Never Used  Substance and Sexual Activity   Alcohol use: Yes    Alcohol/week: 1.0 standard drink of alcohol    Types: 1 Glasses of wine per week   Drug use: No   Sexual activity: Yes    Birth control/protection: Condom, Surgical    Comment: BTL, ablation

## 2023-09-23 NOTE — Progress Notes (Signed)
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 She says that she has always favored the right side. Pain is below the medial malleolus and into the foot. She says that she has tried different supportive shoes, boots, and injections through different doctors.

## 2023-10-03 ENCOUNTER — Ambulatory Visit: Payer: Managed Care, Other (non HMO) | Admitting: Sports Medicine

## 2023-10-03 ENCOUNTER — Encounter: Payer: Self-pay | Admitting: Sports Medicine

## 2023-10-03 DIAGNOSIS — M76821 Posterior tibial tendinitis, right leg: Secondary | ICD-10-CM | POA: Diagnosis not present

## 2023-10-03 DIAGNOSIS — Q666 Other congenital valgus deformities of feet: Secondary | ICD-10-CM | POA: Diagnosis not present

## 2023-10-03 DIAGNOSIS — M25571 Pain in right ankle and joints of right foot: Secondary | ICD-10-CM

## 2023-10-03 NOTE — Progress Notes (Signed)
 Lauren [name] - 48 y.o. female MRN [id number]  Date of birth: 29-Jul-1976  Office Visit Note: Visit Date: 10/03/2023 PCP: Elvera Ozell SAUNDERS, PA-C Referred by: Elvera Ozell SAUNDERS DEVONNA  Subjective: Chief Complaint  Patient presents with   Right Ankle - Pain, Follow-up   HPI: Lauren [name] is a pleasant 48 y.o. female who presents today for follow-up of right ankle pain and b/l pes planus.  Jakiera does report that she has had some improvement with her right ankle and foot after our last shockwave treatment.  She did have some soreness for the first 1 to 2 days but then after this she noticed improvement.  Still has worse pain when on her feet for long peers of time but feels like work has been more bearable.  She is interested in trying the orthotics we discussed at last visit.  She took her last dose of meloxicam .  __________________  Previous treatment: -Custom orthotics, does not feel these were super helpful -Plantar fascia injection: Not helpful.  She has had 1 other injections in the arch of the foot, unsure exactly how much this was beneficial. -Podiatry had placed her in a cast short-term --> transition to Boot (this was most helpful), but then pain returned -She now rotates in good shoewear and orthotics (somewhat helpful)   *She has never had an MRI in the past to evaluate  Pertinent ROS were reviewed with the patient and found to be negative unless otherwise specified above in HPI.   Assessment & Plan: Visit Diagnoses:  1. Pain in right ankle and joints of right foot   2. Pes planovalgus   3. Posterior tibial tendon dysfunction, right    Plan: Toria did have a positive response to her chronic medial foot and arch pain with concomitant posterior tibial dysfunction.  She still of course has some pain but did notice some improvement after 1 treatment.  We did repeat her second trial of extracorporeal shockwave therapy today.  We did place her into orthotics here in the  clinic, which she found to be comfortable and this did help prevent against her pes planovalgus collapse and hyperpronation.  She will try these over the next 1-2 weeks in her shoes to see what sort of relief she receives.  She will follow-up in about 1 week or so for an additional shockwave treatment and further evaluation.  She just took her last dose of meloxicam , I would like to see how her pain is off of scheduled NSAIDs, could consider repeating dose in the future if needed.  Follow-up: Return for f/u in 7-10 days for R-ankle/foot.   Meds & Orders: No orders of the defined types were placed in this encounter.  No orders of the defined types were placed in this encounter.    Procedures: Procedure: ECSWT Indications:  Medial arch pain, PT-tendon dysfunction   Procedure Details Consent: Risks of procedure as well as the alternatives and risks of each were explained to the patient.  Verbal consent for procedure obtained. Time Out: Verified patient identification, verified procedure, site was marked, verified correct patient position. The area was cleaned with alcohol swab.     The Right medial foot arch/plantar fascia and PT-tendon was targeted for Extracorporeal shockwave therapy.    Preset: Plantar fasciitis/Tendonitis Power Level: 80 mJ  Frequency: 10 Hz Impulse/cycles: 2600 Head size: Regular   Patient tolerated procedure well without immediate complications      Clinical History: No specialty comments available.  She reports  that she has never smoked. She has never used smokeless tobacco. No results for input(s): HGBA1C, LABURIC in the last 8760 hours.  Objective:   Vital Signs: LMP  (LMP Unknown)   Physical Exam  Gen: Well-appearing, in no acute distress; non-toxic CV: Well-perfused. Warm.  Resp: Breathing unlabored on room air; no wheezing. Psych: Fluid speech in conversation; appropriate affect; normal thought process  Ortho Exam - *Bilateral feet/gait: There is  severe pes planus with pes planovalgus and a degree of hindfoot valgus.  The patient does fall into pronation upon stance into her walking gait.  I did evaluate her with her orthotics in the shoes that she has a much more significantly neutral heel strike without overpronation.  This is nonantalgic.  There is some mild tenderness to palpation over the medial arch and just inferior medial to the medial malleolus but improved from prior visit.  Imaging: No results found.  Past Medical/Family/Surgical/Social History: Medications & Allergies reviewed per EMR, new medications updated. 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   Irregular bleeding 09/29/2014   Past Medical History:  Diagnosis Date   Anxiety    Bacterial vaginosis    Blood dyscrasia    protein s deficiency   Endometrial polyp 2012   Fibroids 2008   Hypercholesteremia    Hypertension    checks bp at home - 110/70   Insulin  resistance    Intermenstrual bleeding 2008   Lymphedema of left leg    post traumatic   Menorrhagia 2012   Pre-eclampsia, severe    delivered stillbirth   Pregnancy induced hypertension    Preterm labor    Protein S deficiency (HCC)    Took lovenox with twin pregnancy   Trichomonas vaginitis 2012   Vitamin D  deficiency 2012   Family History  Problem Relation Age of Onset   Hyperlipidemia Mother    Hypertension Mother    Anesthesia problems Neg Hx    Hypotension Neg Hx    Malignant hyperthermia Neg Hx    Pseudochol deficiency Neg Hx    Past Surgical History:  Procedure Laterality Date   ANKLE SURGERY  2000, 1999   BILATERAL SALPINGECTOMY Bilateral 09/29/2014   Procedure: BILATERAL SALPINGECTOMY;  Surgeon: Nena App, MD;  Location: [facility];  Service:  Gynecology;  Laterality: Bilateral;   CESAREAN SECTION  2005, 2003   LAPAROSCOPIC TUBAL LIGATION  04/09/2011   Procedure: LAPAROSCOPIC TUBAL LIGATION;  Surgeon: Nena DELENA App, MD;  Location: [facility];  Service: Gynecology;  Laterality: Bilateral;   NOVASURE ABLATION  04/09/2011   Procedure: NOVASURE ABLATION;  Surgeon: Nena DELENA App, MD;  Location: [facility];  Service: Gynecology;  Laterality: N/A;   ROBOTIC ASSISTED TOTAL HYSTERECTOMY N/A 09/29/2014   Procedure: ROBOTIC ASSISTED TOTAL HYSTERECTOMY ;  Surgeon: Nena App, MD;  Location: [facility];  Service: Gynecology;  Laterality: N/A;   WISDOM TOOTH EXTRACTION     Social History   Occupational History   Occupation: Phelebotomist  Tobacco Use   Smoking status: Never   Smokeless tobacco: Never  Vaping Use   Vaping status: Never Used  Substance and Sexual Activity   Alcohol use: Yes    Alcohol/week: 1.0 standard drink of alcohol    Types: 1 Glasses of wine per week   Drug use: No  Sexual activity: Yes    Birth control/protection: Condom, Surgical    Comment: BTL, ablation

## 2023-10-03 NOTE — Progress Notes (Signed)
 Patient says that she feels a bit better today than she did when she was here last week. She says she was sore after the first shockwave treatment. She says she is almost done with the prescription from [place].

## 2023-10-11 ENCOUNTER — Ambulatory Visit: Payer: Managed Care, Other (non HMO) | Admitting: Sports Medicine

## 2023-10-14 ENCOUNTER — Ambulatory Visit: Payer: Managed Care, Other (non HMO) | Admitting: Sports Medicine

## 2023-10-14 ENCOUNTER — Encounter: Payer: Self-pay | Admitting: Sports Medicine

## 2023-10-14 DIAGNOSIS — M2141 Flat foot [pes planus] (acquired), right foot: Secondary | ICD-10-CM

## 2023-10-14 DIAGNOSIS — M76821 Posterior tibial tendinitis, right leg: Secondary | ICD-10-CM

## 2023-10-14 DIAGNOSIS — M25571 Pain in right ankle and joints of right foot: Secondary | ICD-10-CM

## 2023-10-14 DIAGNOSIS — M2142 Flat foot [pes planus] (acquired), left foot: Secondary | ICD-10-CM

## 2023-10-14 MED ORDER — METHYLPREDNISOLONE 4 MG PO TBPK: ORAL_TABLET | ORAL | 0 refills | Status: DC

## 2023-10-14 MED ORDER — DIAZEPAM 5 MG PO TABS: ORAL_TABLET | ORAL | 0 refills | Status: DC

## 2023-10-14 NOTE — Progress Notes (Signed)
Patient says that on Thursday she had a tough day at work and has had stabbing pains in the ankle and arch of the foot since. She says that over the weekend she was walking on the outside of her foot due to pain. She says that on Thursday she took 800mg  of Ibuprofen after work. She is inquiring about trying different insoles, as well as any other medication that may help with pain and/or inflammation.

## 2023-10-14 NOTE — Progress Notes (Signed)
Lauren [name] - 48 y.o. female MRN [id number]  Date of birth: Dec 13, 1975  Office Visit Note: Visit Date: 10/14/2023 PCP: Coralee Rud, PA-C Referred by: Coralee Rud, PA-C  Subjective: Chief Complaint  Patient presents with   Right Ankle - Follow-up   HPI: Lauren [name] is a pleasant 48 y.o. female who presents today for follow-up of chronic right medial ankle and arch foot pain, PT tendon dysfunction with pes planus.  She is still having pain, although we have had 2 treatments of extracorporeal shockwave therapy and she does admit she has certainly noticed improvement from this.  She feels like she is about 50% improved.  She still has pain but it is not as debilitating.  She has been able to wear her orthotics in her shoes and to get new [employer] shoes.  She is not on any scheduled medications, but she is taking infrequent ibuprofen with mild relief. __________________   Previous treatment: -Custom orthotics, does not feel these were super helpful -Plantar fascia injection: Not helpful.  She has had 1 other injections in the arch of the foot, unsure exactly how much this was beneficial. -Podiatry had placed her in a cast short-term --> transition to Boot (this was most helpful), but then pain returned -She now rotates in good shoewear and orthotics (somewhat helpful)  Pertinent ROS were reviewed with the patient and found to be negative unless otherwise specified above in HPI.   Assessment & Plan: Visit Diagnoses:  1. Posterior tibial tendon dysfunction, right   2. Pes planus of both feet   3. Tibialis posterior tendinitis, right   4. Pain in right ankle and joints of right foot    Plan: Impression is chronic right medial ankle and foot pain with significant pes planus and pes planovalgus as well as posterior tibial tendinitis and dysfunction.  She has made about 50% improvement after 2 treatments of extracorporeal shockwave therapy, she was agreeable to proceeding with  an additional treatment today.  Even though she is improving, given the long chronicity and multiple treatment options she has had in the past, she is anxious to have the ankle evaluated further.  We will obtain an MRI to evaluate the posterior tibial tendon as well as the medial ankle and foot internal structures that we cannot evaluate on x-ray.  To help with her degree of tendinitis, we will place her on a 6-day course of Medrol Dosepak.  I did prescribe tablet of Valium to be taken for preprocedural use for her MRI only.  She will follow-up in the next week for repeat ESWT evaluation.  *Treatment considerations: Custom orthotics with more rigid support and cushioning. ?PT-tendon injection.  Follow-up: Return in about 1 week (around 10/21/2023) for f/u in 7-10 days for R-ankle.   Meds & Orders:  Meds ordered this encounter  Medications   methylPREDNISolone (MEDROL DOSEPAK) 4 MG TBPK tablet    Sig: Take per packet instruction. Taper dosing.    Dispense:  1 each    Refill:  0   diazepam (VALIUM) 5 MG tablet    Sig: For pre-procedural (MRI) use only. Take 1 tablet about 45 mins before MRI; if after 30 mins you are not relaxed, may take 2nd tablet    Dispense:  2 tablet    Refill:  0    Orders Placed This Encounter  Procedures   MR Ankle Right w/o contrast     Procedures: Procedure: ECSWT Indications:  Medial arch pain, PT-tendon dysfunction  Procedure Details Consent: Risks of procedure as well as the alternatives and risks of each were explained to the patient.  Verbal consent for procedure obtained. Time Out: Verified patient identification, verified procedure, site was marked, verified correct patient position. The area was cleaned with alcohol swab.     The Right medial foot arch/plantar fascia and PT-tendon was targeted for Extracorporeal shockwave therapy.    Preset: Plantar fasciitis/Tendonitis Power Level: 80-90 mJ  Frequency: 10 Hz Impulse/cycles: 2500 Head size:  Regular   Patient tolerated procedure well without immediate complications       Clinical History: No specialty comments available.  She reports that she has never smoked. She has never used smokeless tobacco. No results for input(s): "HGBA1C", "LABURIC" in the last 8760 hours.  Objective:   Vital Signs: LMP  (LMP Unknown)   Physical Exam  Gen: Well-appearing, in no acute distress; non-toxic CV: Well-perfused. Warm.  Resp: Breathing unlabored on room air; no wheezing. Psych: Fluid speech in conversation; appropriate affect; normal thought process  Ortho Exam - Right foot/ankle: There is severe pes planus with pes planovalgus and a degree of hindfoot valgus bilaterally.  Her feet do fall into pronation with stance.  This is improved with her orthotics.  She has tenderness just posterior to the medial malleolus near the course of the PT tendon as well as in the arch of the foot.  There is difficulty with bilateral heel raise, unable to perform single-leg heel raise on the right.  Imaging: Narrative & Impression  CLINICAL DATA:  Right ankle pain and swelling for 2 days. No known injury.   EXAM: RIGHT ANKLE - COMPLETE 3+ VIEW   COMPARISON:  None Available.   FINDINGS: Normal bone mineralization. The ankle mortise is symmetric and intact. Small plantar and posterior calcaneal heel spurs. No acute fracture dislocation. Large body habitus with subcutaneous edema.   IMPRESSION: 1. Small plantar and posterior calcaneal heel spurs. 2. Large body habitus with subcutaneous edema.     Electronically Signed   By: Neita Garnet M.D.   On: 09/10/2023 19:11   Past Medical/Family/Surgical/Social History: Medications & Allergies reviewed per EMR, new medications updated. 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   Irregular bleeding 09/29/2014   Past Medical History:  Diagnosis Date   Anxiety    Bacterial vaginosis    Blood dyscrasia    protein s deficiency   Endometrial polyp 2012   Fibroids 2008   Hypercholesteremia    Hypertension    checks bp at home - 110/70   Insulin resistance    Intermenstrual bleeding 2008   Lymphedema of left leg    post traumatic   Menorrhagia 2012   Pre-eclampsia, severe    delivered stillbirth   Pregnancy induced hypertension    Preterm labor    Protein S deficiency (HCC)    Took lovenox with twin pregnancy   Trichomonas vaginitis 2012   Vitamin D deficiency 2012   Family History  Problem Relation Age of Onset   Hyperlipidemia Mother    Hypertension Mother    Anesthesia problems Neg Hx    Hypotension Neg Hx    Malignant hyperthermia Neg Hx    Pseudochol deficiency Neg Hx    Past Surgical History:  Procedure Laterality Date  ANKLE SURGERY  2000, 1999   BILATERAL SALPINGECTOMY Bilateral 09/29/2014   Procedure: BILATERAL SALPINGECTOMY;  Surgeon: Silverio Lay, MD;  Location: [facility];  Service: Gynecology;  Laterality: Bilateral;   CESAREAN SECTION  2005, 2003   LAPAROSCOPIC TUBAL LIGATION  04/09/2011   Procedure: LAPAROSCOPIC TUBAL LIGATION;  Surgeon: Esmeralda Arthur, MD;  Location: [facility];  Service: Gynecology;  Laterality: Bilateral;   NOVASURE ABLATION  04/09/2011   Procedure: NOVASURE ABLATION;  Surgeon: Esmeralda Arthur, MD;  Location: [facility];  Service: Gynecology;  Laterality: N/A;   ROBOTIC ASSISTED TOTAL HYSTERECTOMY N/A 09/29/2014   Procedure: ROBOTIC ASSISTED TOTAL HYSTERECTOMY ;  Surgeon: Silverio Lay, MD;  Location: [facility];  Service: Gynecology;  Laterality: N/A;   WISDOM TOOTH EXTRACTION     Social History   Occupational History   Occupation: Phelebotomist  Tobacco Use   Smoking status: Never   Smokeless tobacco: Never  Vaping Use   Vaping status: Never Used  Substance and  Sexual Activity   Alcohol use: Yes    Alcohol/week: 1.0 standard drink of alcohol    Types: 1 Glasses of wine per week   Drug use: No   Sexual activity: Yes    Birth control/protection: Condom, Surgical    Comment: BTL, ablation

## 2023-10-24 ENCOUNTER — Ambulatory Visit: Payer: Managed Care, Other (non HMO) | Admitting: Sports Medicine

## 2023-10-25 ENCOUNTER — Ambulatory Visit: Payer: Managed Care, Other (non HMO) | Admitting: Sports Medicine

## 2023-10-25 DIAGNOSIS — M2142 Flat foot [pes planus] (acquired), left foot: Secondary | ICD-10-CM | POA: Diagnosis not present

## 2023-10-25 DIAGNOSIS — M2141 Flat foot [pes planus] (acquired), right foot: Secondary | ICD-10-CM

## 2023-10-25 DIAGNOSIS — Q666 Other congenital valgus deformities of feet: Secondary | ICD-10-CM

## 2023-10-25 DIAGNOSIS — M76821 Posterior tibial tendinitis, right leg: Secondary | ICD-10-CM | POA: Diagnosis not present

## 2023-10-25 NOTE — Progress Notes (Unsigned)
Patient says that she is feeling overall better than she was but does have more pain the more she is on her feet. Patient has scheduled her MRI for 11/06/2023.

## 2023-10-25 NOTE — Progress Notes (Unsigned)
Lauren [name] - 48 y.o. female MRN [id number]  Date of birth: 1976-07-28  Office Visit Note: Visit Date: 10/25/2023 PCP: Coralee Rud, PA-C Referred by: Coralee Rud, PA-C  Subjective: Chief Complaint  Patient presents with   Right Ankle - Follow-up   HPI: Lauren [name] is a pleasant 48 y.o. female who presents today for follow-up of chronic right medial ankle and foot pain with PT dysfunction and pes planus.   We have performed 3 treatments of extracorporeal shockwave therapy and she is definitely pleased that she is making progress. She continues to have pain the longer she is on her feet, but this is more bearable.  She did complete the 6-day Medrol Dosepak which helped but did not resolve her pain.  She does have an upcoming MRI on 2/12.  She was inquiring about an additional pair of insoles for her other shoes.  Pertinent ROS were reviewed with the patient and found to be negative unless otherwise specified above in HPI.   Assessment & Plan: Visit Diagnoses:  1. Pes planus of both feet   2. Pes planovalgus   3. Posterior tibial tendon dysfunction, right    Plan: Impression is improving chronic right medial ankle and foot pain which is a combination of significant pes planus with pes planovalgus and posterior tendon dysfunction.  She has made good strides with extracorporeal shockwave therapy and improvement of pain with orthotics to help support her longitudinal arch.  She does have an MRI upcoming to rule out any tendon tearing or occult dysfunction we cannot see on x-ray to help guide management.  We will follow-up in about 2 weeks after her MRI to review and discuss next steps.  She has been benefiting from shockwave therapy, may need additional treatments.  Also discussed custom orthotics with more rigid support going forward for longer lasting relief.  We did provide her an additional set of orthotic insoles here in the clinic, as she has found them  beneficial.  Follow-up: Return in about 25 days (around 11/19/2023) for f/u last week in February (R-ankle/PT).   Meds & Orders: No orders of the defined types were placed in this encounter.  No orders of the defined types were placed in this encounter.    Procedures: Procedure: ECSWT Indications:  Medial arch pain, PT-tendon dysfunction   Procedure Details Consent: Risks of procedure as well as the alternatives and risks of each were explained to the patient.  Verbal consent for procedure obtained. Time Out: Verified patient identification, verified procedure, site was marked, verified correct patient position. The area was cleaned with alcohol swab.     The Right medial foot arch/plantar fascia and PT-tendon was targeted for Extracorporeal shockwave therapy.    Preset: Plantar fasciitis/Tendonitis Power Level: 90 mJ   Frequency: 10-11 Hz Impulse/cycles: 2500 Head size: Regular   Patient tolerated procedure well without immediate complications       Clinical History: No specialty comments available.  She reports that she has never smoked. She has never used smokeless tobacco. No results for input(s): "HGBA1C", "LABURIC" in the last 8760 hours.  Objective:   Vital Signs: LMP  (LMP Unknown)   Physical Exam  Gen: Well-appearing, in no acute distress; non-toxic CV: Well-perfused. Warm.  Resp: Breathing unlabored on room air; no wheezing. Psych: Fluid speech in conversation; appropriate affect; normal thought process  Ortho Exam - Right foot/ankle: Positive TTP just inferior to the medial malleolus near the course of the posterior tibial tendon.  There is severe pes planus with pes planovalgus, although this does improve with her orthotics in place.  Imaging: No results found.  Past Medical/Family/Surgical/Social History: Medications & Allergies reviewed per EMR, new medications updated. 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   Irregular bleeding 09/29/2014   Past Medical History:  Diagnosis Date   Anxiety    Bacterial vaginosis    Blood dyscrasia    protein s deficiency   Endometrial polyp 2012   Fibroids 2008   Hypercholesteremia    Hypertension    checks bp at home - 110/70   Insulin resistance    Intermenstrual bleeding 2008   Lymphedema of left leg    post traumatic   Menorrhagia 2012   Pre-eclampsia, severe    delivered stillbirth   Pregnancy induced hypertension    Preterm labor    Protein S deficiency (HCC)    Took lovenox with twin pregnancy   Trichomonas vaginitis 2012   Vitamin D deficiency 2012   Family History  Problem Relation Age of Onset   Hyperlipidemia Mother    Hypertension Mother    Anesthesia problems Neg Hx    Hypotension Neg Hx    Malignant hyperthermia Neg Hx    Pseudochol deficiency Neg Hx    Past Surgical History:  Procedure Laterality Date   ANKLE SURGERY  2000, 1999   BILATERAL SALPINGECTOMY Bilateral 09/29/2014   Procedure: BILATERAL SALPINGECTOMY;  Surgeon: Silverio Lay, MD;  Location: [facility];  Service: Gynecology;  Laterality: Bilateral;   CESAREAN SECTION  2005, 2003   LAPAROSCOPIC TUBAL LIGATION  04/09/2011   Procedure: LAPAROSCOPIC TUBAL LIGATION;  Surgeon: Esmeralda Arthur, MD;  Location: [facility];  Service: Gynecology;  Laterality: Bilateral;   NOVASURE ABLATION  04/09/2011   Procedure: NOVASURE ABLATION;  Surgeon: Esmeralda Arthur, MD;  Location: [facility];  Service: Gynecology;  Laterality: N/A;   ROBOTIC ASSISTED TOTAL HYSTERECTOMY N/A 09/29/2014   Procedure: ROBOTIC ASSISTED TOTAL HYSTERECTOMY ;  Surgeon: Silverio Lay, MD;  Location: [facility];  Service: Gynecology;  Laterality: N/A;   WISDOM TOOTH EXTRACTION     Social History   Occupational History   Occupation:  Phelebotomist  Tobacco Use   Smoking status: Never   Smokeless tobacco: Never  Vaping Use   Vaping status: Never Used  Substance and Sexual Activity   Alcohol use: Yes    Alcohol/week: 1.0 standard drink of alcohol    Types: 1 Glasses of wine per week   Drug use: No   Sexual activity: Yes    Birth control/protection: Condom, Surgical    Comment: BTL, ablation

## 2023-10-26 ENCOUNTER — Encounter: Payer: Self-pay | Admitting: Sports Medicine

## 2023-11-05 ENCOUNTER — Telehealth: Payer: Self-pay | Admitting: Sports Medicine

## 2023-11-05 NOTE — Telephone Encounter (Signed)
[facility] called to share information that this pt MRI appt was canceled due to insurance.

## 2023-11-06 ENCOUNTER — Other Ambulatory Visit: Payer: Managed Care, Other (non HMO)

## 2023-11-07 ENCOUNTER — Other Ambulatory Visit: Payer: Self-pay

## 2023-11-07 DIAGNOSIS — M25571 Pain in right ankle and joints of right foot: Secondary | ICD-10-CM

## 2023-11-07 DIAGNOSIS — M76821 Posterior tibial tendinitis, right leg: Secondary | ICD-10-CM

## 2023-11-09 ENCOUNTER — Ambulatory Visit
Admission: RE | Admit: 2023-11-09 | Discharge: 2023-11-09 | Disposition: A | Discharge status: Home / Self Care | Payer: Managed Care, Other (non HMO) | Source: Ambulatory Visit | Attending: Sports Medicine | Admitting: Sports Medicine

## 2023-11-09 DIAGNOSIS — M25571 Pain in right ankle and joints of right foot: Secondary | ICD-10-CM

## 2023-11-09 DIAGNOSIS — M76821 Posterior tibial tendinitis, right leg: Secondary | ICD-10-CM

## 2023-11-19 ENCOUNTER — Ambulatory Visit (INDEPENDENT_AMBULATORY_CARE_PROVIDER_SITE_OTHER): Payer: Managed Care, Other (non HMO) | Admitting: Sports Medicine

## 2023-11-19 DIAGNOSIS — Q666 Other congenital valgus deformities of feet: Secondary | ICD-10-CM | POA: Diagnosis not present

## 2023-11-19 DIAGNOSIS — M76821 Posterior tibial tendinitis, right leg: Secondary | ICD-10-CM | POA: Diagnosis not present

## 2023-11-19 DIAGNOSIS — M25571 Pain in right ankle and joints of right foot: Secondary | ICD-10-CM

## 2023-11-19 NOTE — Progress Notes (Unsigned)
 Patient says that she is feeling overall about 90% better. She says that the insoles and the shockwave seem to be really helping and she would like to continue with that if she is able. She went on a weeklong trip and did an excursion everyday, so she took 800mg  of Ibuprofen at the end of each day which seemed to help; she only had sharp pain on the last day. She says that now she does not get that sharp pain in the foot but rather her feet will feel sore if she has been on them for most of the day.

## 2023-11-19 NOTE — Progress Notes (Unsigned)
 LIANNAH YARBOUGH - 48 y.o. female MRN [id number]  Date of birth: 10/14/1975  Office Visit Note: Visit Date: 11/19/2023 PCP: Pcp, No Referred by: Coralee Rud, PA-C  Subjective: Chief Complaint  Patient presents with  . Right Ankle - Follow-up   HPI: TALEEYAH BORA is a pleasant 48 y.o. female who presents today for ***  Pertinent ROS were reviewed with the patient and found to be negative unless otherwise specified above in HPI.   Assessment & Plan: Visit Diagnoses: No diagnosis found.  Plan: ***  Follow-up: No follow-ups on file.   Meds & Orders: No orders of the defined types were placed in this encounter.  No orders of the defined types were placed in this encounter.    Procedures: No procedures performed      Clinical History: No specialty comments available.  She reports that she has never smoked. She has never used smokeless tobacco. No results for input(s): "HGBA1C", "LABURIC" in the last 8760 hours.  Objective:   Vital Signs: LMP  (LMP Unknown)   Physical Exam  Gen: Well-appearing, in no acute distress; non-toxic CV: Well-perfused. Warm.  Resp: Breathing unlabored on room air; no wheezing. Psych: Fluid speech in conversation; appropriate affect; normal thought process  Ortho Exam - ***  Imaging: No results found.  Past Medical/Family/Surgical/Social History: Medications & Allergies reviewed per EMR, new medications updated. Patient Active Problem List   Diagnosis Date Noted  . Chronic venous insufficiency 05/21/2018  . Lymphedema 05/21/2018  . Hypertension 04/11/2018  . Hypercholesteremia 04/11/2018  . Protein S deficiency (HCC) 04/11/2018  . 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 Irregular bleeding 09/29/2014   Past Medical History:  Diagnosis Date  . Anxiety   . Bacterial vaginosis   . Blood dyscrasia    protein s deficiency  . Endometrial polyp 2012  . Fibroids 2008  . Hypercholesteremia   . Hypertension     checks bp at home - 110/70  . Insulin resistance   . Intermenstrual bleeding 2008  . Lymphedema of left leg    post traumatic  . Menorrhagia 2012  . Pre-eclampsia, severe    delivered stillbirth  . Pregnancy induced hypertension   . Preterm labor   . Protein S deficiency (HCC)    Took lovenox with twin pregnancy  . Trichomonas vaginitis 2012  . Vitamin D deficiency 2012   Family History  Problem Relation Age of Onset  . Hyperlipidemia Mother   . Hypertension Mother   . Anesthesia problems Neg Hx   . Hypotension Neg Hx   . Malignant hyperthermia Neg Hx   . Pseudochol deficiency Neg Hx    Past Surgical History:  Procedure Laterality Date  . ANKLE SURGERY  2000, 1999  . BILATERAL SALPINGECTOMY Bilateral 09/29/2014   Procedure: BILATERAL SALPINGECTOMY;  Surgeon: Silverio Lay, MD;  Location: [facility];  Service: Gynecology;  Laterality: Bilateral;  . CESAREAN SECTION  2005, 2003  . LAPAROSCOPIC TUBAL LIGATION  04/09/2011   Procedure: LAPAROSCOPIC TUBAL LIGATION;  Surgeon: Esmeralda Arthur, MD;  Location: [facility];  Service: Gynecology;  Laterality: Bilateral;  . NOVASURE ABLATION  04/09/2011   Procedure: NOVASURE ABLATION;  Surgeon: Esmeralda Arthur, MD;  Location: [facility];  Service: Gynecology;  Laterality: N/A;  . ROBOTIC ASSISTED TOTAL HYSTERECTOMY N/A 09/29/2014   Procedure: ROBOTIC ASSISTED TOTAL HYSTERECTOMY ;  Surgeon: Silverio Lay, MD;  Location: [facility];  Service: Gynecology;  Laterality: N/A;  . WISDOM TOOTH EXTRACTION  Social History   Occupational History  . Occupation: Phelebotomist  Tobacco Use  . Smoking status: Never  . Smokeless tobacco: Never  Vaping Use  . Vaping status: Never Used  Substance and Sexual Activity  . Alcohol use: Yes    Alcohol/week: 1.0 standard drink of alcohol    Types: 1 Glasses of wine per week  . Drug use: No  . Sexual activity: Yes    Birth control/protection: Condom, Surgical    Comment: BTL, ablation

## 2023-11-20 ENCOUNTER — Encounter: Payer: Self-pay | Admitting: Sports Medicine

## 2023-12-06 ENCOUNTER — Other Ambulatory Visit (INDEPENDENT_AMBULATORY_CARE_PROVIDER_SITE_OTHER): Payer: Self-pay

## 2023-12-06 ENCOUNTER — Ambulatory Visit: Payer: Managed Care, Other (non HMO) | Admitting: Sports Medicine

## 2023-12-06 ENCOUNTER — Encounter: Payer: Self-pay | Admitting: Sports Medicine

## 2023-12-06 DIAGNOSIS — M76821 Posterior tibial tendinitis, right leg: Secondary | ICD-10-CM

## 2023-12-06 DIAGNOSIS — G8929 Other chronic pain: Secondary | ICD-10-CM | POA: Diagnosis not present

## 2023-12-06 DIAGNOSIS — M65962 Unspecified synovitis and tenosynovitis, left lower leg: Secondary | ICD-10-CM | POA: Diagnosis not present

## 2023-12-06 DIAGNOSIS — M25562 Pain in left knee: Secondary | ICD-10-CM | POA: Diagnosis not present

## 2023-12-06 DIAGNOSIS — Q666 Other congenital valgus deformities of feet: Secondary | ICD-10-CM | POA: Diagnosis not present

## 2023-12-06 MED ORDER — CELECOXIB 100 MG PO CAPS: 100.0000 mg | ORAL_CAPSULE | Freq: Two times a day (BID) | ORAL | 0 refills | Status: AC

## 2023-12-06 NOTE — Progress Notes (Signed)
 Lauren [name] - 48 y.o. female MRN [id number]  Date of birth: 07-Dec-1975  Office Visit Note: Visit Date: 12/06/2023 PCP: Pcp, No Referred by: No ref. provider found  Subjective: Chief Complaint  Patient presents with   Right Ankle - Follow-up   HPI: Lauren [name] is a pleasant 48 y.o. female who presents today for ollow-up of chronic right medial ankle pain with PT dysfunction and tendinosis. Also with new onset L-knee pain.  She had found significant improvement in her foot and ankle pain with prior extracorporeal shockwave therapy and correction in her orthotics.  She was at the point where she was at least 90% improved, however here recently over the last week or more she unfortunately has had a setback.  No specific injury but worsened with increased physical demands.  She also is experiencing left knee pain as well which is more prominent in the posterior aspect of the knee.  She has had pain in the knees before but usually the right has been the most bothersome.  She was excited about starting back in working out and physical activity but feels like she is not quite ready to proceed given her setback.  She has 1 week left of her limited work restrictions but would like to discuss extending the slightly until further improved.  Pertinent ROS were reviewed with the patient and found to be negative unless otherwise specified above in HPI.   Assessment & Plan: Visit Diagnoses:  1. Chronic pain of left knee   2. Synovitis of left knee   3. Posterior tibial tendon dysfunction, right   4. Pes planovalgus    Plan: Impression is chronic right medial ankle pain with a combination of significant pes planovalgus and posterior tibial tendinosis with dysfunction, this has previously largely improved with correction and orthotics and extracorporeal shockwave therapy but unfortunately she has had an exacerbation with mild setback.  We did repeat extracorporeal shockwave treatment today,  patient tolerated well.  She also is dealing with left knee pain which does have some mild arthritic changes and clinically a degree of knee synovitis.  For both of these, I would like to start her on a short course of Celebrex 100 mg twice daily which she will take for the next 3 weeks or so and then hopefully be able to discontinue.  I would like to extend her work modifications for the next month and I will see her back in 3 weeks and gauge her degree of improvement and hopefully we can return her to full duty when she is improving.  *I will send a message to our office staff, Tammy, to extend her work restrictions for 1 additional month from today.  Follow-up: Return in about 3 weeks (around 12/27/2023) for R-foot and L-knee (30-mins for proc) - poss knee [place].   Meds & Orders:  Meds ordered this encounter  Medications   celecoxib (CELEBREX) 100 MG capsule    Sig: Take 1 capsule (100 mg total) by mouth 2 (two) times daily for 21 days.    Dispense:  42 capsule    Refill:  0    Orders Placed This Encounter  Procedures   XR Knee Complete 4 Views Left     Procedures: Procedure: ECSWT Indications:  Medial arch pain, PT-tendon dysfunction   Procedure Details Consent: Risks of procedure as well as the alternatives and risks of each were explained to the patient.  Verbal consent for procedure obtained. Time Out: Verified patient identification, verified procedure,  site was marked, verified correct patient position. The area was cleaned with alcohol swab.     The Right medial foot arch/plantar fascia and PT-tendon was targeted for Extracorporeal shockwave therapy.    Preset: Plantar fasciitis/Tendonitis Power Level: 100 mJ   Frequency: 11 Hz Impulse/cycles: 2500 Head size: Regular   Patient tolerated procedure well without immediate complications        Clinical History: No specialty comments available.  She reports that she has never smoked. She has never used smokeless tobacco. No  results for input(s): "HGBA1C", "LABURIC" in the last 8760 hours.  Objective:   Vital Signs: LMP  (LMP Unknown)   Physical Exam  Gen: Well-appearing, in no acute distress; non-toxic CV: Well-perfused. Warm.  Resp: Breathing unlabored on room air; no wheezing. Psych: Fluid speech in conversation; appropriate affect; normal thought process  Ortho Exam - Right foot/ankle: Positive TTP near the course of the posterior tibial tendon inferior to the medial malleolus.  There is severe pes planus with pes planovalgus which does mostly correct in her orthotics.  No soft tissue swelling here.  - Left knee: There is generalized tenderness within the posterior aspect of the popliteal fossa although no space-occupying lesion noted.  There is pain with endrange flexion.  There is no varus or valgus instability about the knee.  Imaging: XR Knee Complete 4 Views Left Result Date: 12/06/2023 4 views of the left knee including standing AP, Rosenberg, lateral and sunrise view was ordered and reviewed by myself.  There is mild medial tibiofemoral joint space narrowing with small spurring bilaterally over the medial tibial plateau.  This is consistent with mild osteoarthritic change.  There is possibly a small effusion on the lateral film of the left knee.  There is no malalignment or acute bony fracture otherwise noted.   Past Medical/Family/Surgical/Social History: Medications & Allergies reviewed per EMR, new medications updated. 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   Irregular bleeding 09/29/2014   Past Medical History:  Diagnosis Date   Anxiety    Bacterial vaginosis    Blood dyscrasia    protein s deficiency   Endometrial polyp 2012   Fibroids 2008    Hypercholesteremia    Hypertension    checks bp at home - 110/70   Insulin resistance    Intermenstrual bleeding 2008   Lymphedema of left leg    post traumatic   Menorrhagia 2012   Pre-eclampsia, severe    delivered stillbirth   Pregnancy induced hypertension    Preterm labor    Protein S deficiency (HCC)    Took lovenox with twin pregnancy   Trichomonas vaginitis 2012   Vitamin D deficiency 2012   Family History  Problem Relation Age of Onset   Hyperlipidemia Mother    Hypertension Mother    Anesthesia problems Neg Hx    Hypotension Neg Hx    Malignant hyperthermia Neg Hx    Pseudochol deficiency Neg Hx    Past Surgical History:  Procedure Laterality Date   ANKLE SURGERY  2000, 1999   BILATERAL SALPINGECTOMY Bilateral 09/29/2014   Procedure: BILATERAL SALPINGECTOMY;  Surgeon: Silverio Lay, MD;  Location: [facility];  Service: Gynecology;  Laterality: Bilateral;   CESAREAN SECTION  2005, 2003   LAPAROSCOPIC TUBAL LIGATION  04/09/2011  Procedure: LAPAROSCOPIC TUBAL LIGATION;  Surgeon: Esmeralda Arthur, MD;  Location: [facility];  Service: Gynecology;  Laterality: Bilateral;   NOVASURE ABLATION  04/09/2011   Procedure: NOVASURE ABLATION;  Surgeon: Esmeralda Arthur, MD;  Location: [facility];  Service: Gynecology;  Laterality: N/A;   ROBOTIC ASSISTED TOTAL HYSTERECTOMY N/A 09/29/2014   Procedure: ROBOTIC ASSISTED TOTAL HYSTERECTOMY ;  Surgeon: Silverio Lay, MD;  Location: [facility];  Service: Gynecology;  Laterality: N/A;   WISDOM TOOTH EXTRACTION     Social History   Occupational History   Occupation: Phelebotomist  Tobacco Use   Smoking status: Never   Smokeless tobacco: Never  Vaping Use   Vaping status: Never Used  Substance and Sexual Activity   Alcohol use: Yes    Alcohol/week: 1.0 standard drink of alcohol    Types: 1 Glasses of wine per week   Drug use: No   Sexual activity: Yes    Birth control/protection: Condom, Surgical    Comment: BTL, ablation

## 2023-12-30 ENCOUNTER — Encounter: Payer: Self-pay | Admitting: Sports Medicine

## 2023-12-30 ENCOUNTER — Ambulatory Visit: Admitting: Sports Medicine

## 2023-12-30 DIAGNOSIS — Q666 Other congenital valgus deformities of feet: Secondary | ICD-10-CM

## 2023-12-30 DIAGNOSIS — M25562 Pain in left knee: Secondary | ICD-10-CM

## 2023-12-30 DIAGNOSIS — M76821 Posterior tibial tendinitis, right leg: Secondary | ICD-10-CM | POA: Diagnosis not present

## 2023-12-30 DIAGNOSIS — M25571 Pain in right ankle and joints of right foot: Secondary | ICD-10-CM | POA: Diagnosis not present

## 2023-12-30 DIAGNOSIS — G8929 Other chronic pain: Secondary | ICD-10-CM

## 2023-12-30 NOTE — Progress Notes (Signed)
 Lauren [name] - 48 y.o. female MRN [id number]  Date of birth: [birth year]  Office Visit Note: Visit Date: 12/30/2023 PCP: Pcp, No Referred by: No ref. provider found  Subjective: Chief Complaint  Patient presents with   Right Ankle - Follow-up   HPI: Lauren [name] is a very pleasant 48 y.o. female who presents today for follow-up of chronic right medial ankle pain with posterior tibial tendon dysfunction and tendinosis, left knee pain follow-up.  Right ankle/foot -this is doing well.  She has responded excellently in the past extracorporeal shockwave therapy as well as wearing her supportive orthotics and rotating good supportive shoe wear.  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  But overall feels well and has returned to work fully.  Left knee - this is doing well in addition.  She did use Celebrex for the first 4 days or so and then after that she was able to discontinue early given her improvement.  She still has some of this left but only is taking it on an as-needed basis.  Pertinent ROS were reviewed with the patient and found to be negative unless otherwise specified above in HPI.   Assessment & Plan: Visit Diagnoses:  1. Pain in right ankle and joints of right foot   2. Posterior tibial tendon dysfunction, right   3. Pes planovalgus   4. Chronic pain of left knee    Plan: Impression is improved chronic right medial ankle pain with a degree of posterior tibial tendinosis in the setting of significant pes planovalgus. Lauren [name] made excellent progress previously with extracorporeal shockwave therapy, HEP and her orthotics.  She had a brief setback so we modified her work restrictions at last visit, currently she is doing well and has already returned to work fully.  She did have a degree of left knee synovitis which calmed down as well with a short course of Celebrex 100 mg twice daily as needed.  At this point, she may return  to activity without restriction and may get started back in working out and other physical fitness.  We discussed the importance of maintaining good shoe wear with her orthotics in her shoes to help prevent exacerbation of her PT dysfunction and medial ankle pain.  She has and can discontinue Celebrex scheduled.  We discussed the nature of taking this as needed for a few days when she is on her feet for long hours of the day and has a mild exacerbation.  In the future we could consider additional shockwave only as needed but given her improvement we will hold from this and she will follow-up with [place] as needed. Continue home exercises for prevention.  Follow-up: Return if symptoms worsen or fail to improve.   Meds & Orders: No orders of the defined types were placed in this encounter.  No orders of the defined types were placed in this encounter.    Procedures: Procedure: ECSWT Indications:  Medial arch pain, PT-tendon dysfunction   Procedure Details Consent: Risks of procedure as well as the alternatives and risks of each were explained to the patient.  Verbal consent for procedure obtained. Time Out: Verified patient identification, verified procedure, site was marked, verified correct patient position. The area was cleaned with alcohol swab.     The Right medial foot arch/plantar fascia and PT-tendon was targeted for Extracorporeal shockwave therapy.    Preset: Tendonitis Power Level: 110 mJ   Frequency: 11  Hz Impulse/cycles: 2800 Head size: Regular   Patient tolerated procedure well without immediate complications       Clinical History: No specialty comments available.  She reports that she has never smoked. She has never used smokeless tobacco. No results for input(s): "HGBA1C", "LABURIC" in the last 8760 hours.  Objective:   Vital Signs: LMP  (LMP Unknown)   Physical Exam  Gen: Well-appearing, in no acute distress; non-toxic CV: Well-perfused. Warm.  Resp: Breathing  unlabored on room air; no wheezing. Psych: Fluid speech in conversation; appropriate affect; normal thought process  Ortho Exam - Right foot/ankle: No specific tenderness around the medial malleolus or around the course of the posterior tibial tendon.  There is significant pes planus with a degree of pes planovalgus with standing.  Imaging: No results found.  Past Medical/Family/Surgical/Social History: Medications & Allergies reviewed per EMR, new medications updated. 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   Irregular bleeding 09/29/2014   Past Medical History:  Diagnosis Date   Anxiety    Bacterial vaginosis    Blood dyscrasia    protein s deficiency   Endometrial polyp 2012   Fibroids 2008   Hypercholesteremia    Hypertension    checks bp at home - 110/70   Insulin resistance    Intermenstrual bleeding 2008   Lymphedema of left leg    post traumatic   Menorrhagia 2012   Pre-eclampsia, severe    delivered stillbirth   Pregnancy induced hypertension    Preterm labor    Protein S deficiency (HCC)    Took lovenox with twin pregnancy   Trichomonas vaginitis 2012   Vitamin D deficiency 2012   Family History  Problem Relation Age of Onset   Hyperlipidemia Mother    Hypertension Mother    Anesthesia problems Neg Hx    Hypotension Neg Hx    Malignant hyperthermia Neg Hx    Pseudochol deficiency Neg Hx    Past Surgical History:  Procedure Laterality Date   ANKLE SURGERY  2000, 1999   BILATERAL SALPINGECTOMY Bilateral 09/29/2014   Procedure: BILATERAL SALPINGECTOMY;  Surgeon: Silverio Lay, MD;  Location: [facility];  Service: Gynecology;  Laterality: Bilateral;   CESAREAN SECTION  2005, 2003   LAPAROSCOPIC TUBAL LIGATION  04/09/2011    Procedure: LAPAROSCOPIC TUBAL LIGATION;  Surgeon: Esmeralda Arthur, MD;  Location: [facility];  Service: Gynecology;  Laterality: Bilateral;   NOVASURE ABLATION  04/09/2011   Procedure: NOVASURE ABLATION;  Surgeon: Esmeralda Arthur, MD;  Location: [facility];  Service: Gynecology;  Laterality: N/A;   ROBOTIC ASSISTED TOTAL HYSTERECTOMY N/A 09/29/2014   Procedure: ROBOTIC ASSISTED TOTAL HYSTERECTOMY ;  Surgeon: Silverio Lay, MD;  Location: [facility];  Service: Gynecology;  Laterality: N/A;   WISDOM TOOTH EXTRACTION     Social History   Occupational History   Occupation: Phelebotomist  Tobacco Use   Smoking status: Never   Smokeless tobacco: Never  Vaping Use   Vaping status: Never Used  Substance and Sexual Activity   Alcohol use: Yes    Alcohol/week: 1.0 standard drink of alcohol    Types: 1 Glasses of wine per week   Drug use: No   Sexual activity: Yes    Birth control/protection: Condom, Surgical    Comment: BTL, ablation

## 2023-12-30 NOTE — Progress Notes (Signed)
 Patient says that she is feeling better than last visit. She says that the Celebrex and the shockwave seemed to help the knee and the foot.

## 2024-02-11 ENCOUNTER — Other Ambulatory Visit: Payer: Self-pay | Admitting: Obstetrics and Gynecology

## 2024-02-11 DIAGNOSIS — Z1231 Encounter for screening mammogram for malignant neoplasm of breast: Secondary | ICD-10-CM

## 2024-03-10 ENCOUNTER — Ambulatory Visit
Admission: RE | Admit: 2024-03-10 | Discharge: 2024-03-10 | Disposition: A | Discharge status: Home / Self Care | Source: Ambulatory Visit | Attending: Obstetrics and Gynecology | Admitting: Obstetrics and Gynecology

## 2024-03-10 DIAGNOSIS — Z1231 Encounter for screening mammogram for malignant neoplasm of breast: Secondary | ICD-10-CM

## 2024-07-03 ENCOUNTER — Ambulatory Visit (HOSPITAL_COMMUNITY)
Admission: RE | Admit: 2024-07-03 | Discharge: 2024-07-03 | Disposition: A | Discharge status: Home / Self Care | Source: Ambulatory Visit | Attending: Nurse Practitioner | Admitting: Nurse Practitioner

## 2024-07-03 ENCOUNTER — Other Ambulatory Visit: Payer: Self-pay | Admitting: Nurse Practitioner

## 2024-07-03 DIAGNOSIS — D6859 Other primary thrombophilia: Secondary | ICD-10-CM | POA: Insufficient documentation

## 2024-07-03 DIAGNOSIS — O99119 Other diseases of the blood and blood-forming organs and certain disorders involving the immune mechanism complicating pregnancy, unspecified trimester: Secondary | ICD-10-CM | POA: Insufficient documentation

## 2024-07-03 DIAGNOSIS — M79602 Pain in left arm: Secondary | ICD-10-CM | POA: Insufficient documentation

## 2024-07-03 DIAGNOSIS — R2232 Localized swelling, mass and lump, left upper limb: Secondary | ICD-10-CM | POA: Diagnosis present

## 2024-07-17 ENCOUNTER — Other Ambulatory Visit (HOSPITAL_COMMUNITY): Payer: Self-pay | Admitting: Nurse Practitioner

## 2024-07-17 DIAGNOSIS — M79601 Pain in right arm: Secondary | ICD-10-CM

## 2024-07-20 ENCOUNTER — Ambulatory Visit (HOSPITAL_COMMUNITY)
Admission: RE | Admit: 2024-07-20 | Discharge: 2024-07-20 | Disposition: A | Discharge status: Home / Self Care | Source: Ambulatory Visit | Attending: Nurse Practitioner | Admitting: Nurse Practitioner

## 2024-07-20 ENCOUNTER — Other Ambulatory Visit (HOSPITAL_COMMUNITY): Payer: Self-pay | Admitting: Nurse Practitioner

## 2024-07-20 DIAGNOSIS — M25522 Pain in left elbow: Secondary | ICD-10-CM | POA: Diagnosis present

## 2024-07-27 ENCOUNTER — Encounter: Payer: Self-pay | Admitting: Radiology

## 2024-09-02 ENCOUNTER — Ambulatory Visit: Attending: Cardiovascular Disease | Admitting: Cardiovascular Disease

## 2024-09-02 ENCOUNTER — Encounter: Payer: Self-pay | Admitting: Cardiovascular Disease

## 2024-09-02 VITALS — BP 150/80 | HR 49 | Ht 66.5 in | Wt 247.0 lb

## 2024-09-02 DIAGNOSIS — E7800 Pure hypercholesterolemia, unspecified: Secondary | ICD-10-CM | POA: Diagnosis not present

## 2024-09-02 DIAGNOSIS — I10 Essential (primary) hypertension: Secondary | ICD-10-CM

## 2024-09-02 NOTE — Patient Instructions (Signed)
 Medication Instructions:  Your physician recommends that you continue on your current medications as directed. Please refer to the Current Medication list given to you today.  *If you need a refill on your cardiac medications before your next appointment, please call your pharmacy*  Lab Work: Your physician recommends that you return for lab work in: 1 months for FASTING Lipid/liver panel  If you have labs (blood work) drawn today and your tests are completely normal, you will receive your results only by: MyChart Message (if you have MyChart) OR A paper copy in the mail If you have any lab test that is abnormal or we need to change your treatment, we will call you to review the results.  Testing/Procedures: Dr. Court has ordered a CT coronary calcium score.   Test locations:  [facility] at [facility]  [place] [facility] at [facility]  This is $99 out of pocket.   Coronary CalciumScan A coronary calcium scan is an imaging test used to look for deposits of calcium and other fatty materials (plaques) in the inner lining of the blood vessels of the heart (coronary arteries). These deposits of calcium and plaques can partly clog and narrow the coronary arteries without producing any symptoms or warning signs. This puts a person at risk for a heart attack. This test can detect these deposits before symptoms develop. Tell a health care provider about: Any allergies you have. All medicines you are taking, including vitamins, herbs, eye drops, creams, and over-the-counter medicines. Any problems you or family members have had with anesthetic medicines. Any blood disorders you have. Any surgeries you have had. Any medical conditions you have. Whether you are pregnant or may be pregnant. What are the risks? Generally, this is a safe procedure. However, problems may occur, including: Harm to a  pregnant woman and her unborn baby. This test involves the use of radiation. Radiation exposure can be dangerous to a pregnant woman and her unborn baby. If you are pregnant, you generally should not have this procedure done. Slight increase in the risk of cancer. This is because of the radiation involved in the test. What happens before the procedure? No preparation is needed for this procedure. What happens during the procedure? You will undress and remove any jewelry around your neck or chest. You will put on a hospital gown. Sticky electrodes will be placed on your chest. The electrodes will be connected to an electrocardiogram (ECG) machine to record a tracing of the electrical activity of your heart. A CT scanner will take pictures of your heart. During this time, you will be asked to lie still and hold your breath for 2-3 seconds while a picture of your heart is being taken. The procedure may vary among health care providers and hospitals. What happens after the procedure? You can get dressed. You can return to your normal activities. It is up to you to get the results of your test. Ask your health care provider, or the department that is doing the test, when your results will be ready. Summary A coronary calcium scan is an imaging test used to look for deposits of calcium and other fatty materials (plaques) in the inner lining of the blood vessels of the heart (coronary arteries). Generally, this is a safe procedure. Tell your health care provider if you are pregnant or may be pregnant. No preparation is needed for this procedure. A CT scanner will take pictures  of your heart. You can return to your normal activities after the scan is done. This information is not intended to replace advice given to you by your health care provider. Make sure you discuss any questions you have with your health care provider. Document Released: 03/08/2008 Document Revised: 07/30/2016 Document Reviewed:  07/30/2016 Elsevier Interactive Patient Education  2017 [employer].   Follow-Up: At [facility], you and your health needs are our priority.  As part of our continuing mission to provide you with exceptional heart care, our providers are all part of one team.  This team includes your primary Cardiologist (physician) and Advanced Practice Providers or APPs (Physician Assistants and Nurse Practitioners) who all work together to provide you with the care you need, when you need it.  Your next appointment:   6 month(s)  Provider:   Dorn Lesches, MD     We recommend signing up for the patient portal called MyChart.  Sign up information is provided on this After Visit Summary.  MyChart is used to connect with patients for Virtual Visits (Telemedicine).  Patients are able to view lab/test results, encounter notes, upcoming appointments, etc.  Non-urgent messages can be sent to your provider as well.   To learn more about what you can do with MyChart, go to [email or web address].   Other Instructions We will make you an appointment to see our clinical PharmD to discuss GLP1 medications.

## 2024-09-02 NOTE — Progress Notes (Signed)
 09/02/2024 Altamese CHRISTELLA Lunger   December 23, 1975  [id number]  Primary Physician Pcp, No Primary Cardiologist: Dorn JINNY Lesches MD [facility], [place], [place]  HPI:  ARIYANNA OIEN is a 48 y.o. 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  She was self-referred to be established because of risk factors.  Her primary cardiovascular provider was Garrel Kubas did not like he PA-C her cardiac risk factors are notable for treated hypertension and hyperlipidemia.  She does not smoke.  There is no family history of heart disease.  She is never had heart attack or stroke.  She denies chest pain or shortness of breath.  Of note, she did have a history of protein S deficiency and was followed by Dr. Timmy in the past on Lovenox when she was younger but not currently.  She has not had a thrombotic event in the recent past.  She really does not exercise because of her weight and orthopedic issues.  She does have a family history of RV cardiomyopathy in her mother but no one has had ischemic heart disease.   Current Meds  Medication Sig   ALPRAZolam (XANAX) 0.5 MG tablet Take 0.5 mg by mouth at bedtime as needed for anxiety.   hydrochlorothiazide (HYDRODIURIL) 25 MG tablet Take 25 mg by mouth daily.   NIFEdipine (ADALAT CC) 30 MG 24 hr tablet Take 30 mg by mouth daily.   progesterone (PROMETRIUM) 200 MG capsule take 1 capsule po qhs   rosuvastatin (CRESTOR) 20 MG tablet Take 20 mg by mouth daily.     No Known Allergies  Social History   Socioeconomic History   Marital status: Divorced    Spouse name: Not on file   Number of children: 2   Years of education: Not on file   Highest education level: Not on file  Occupational History   Occupation: Phelebotomist  Tobacco Use   Smoking status: Never   Smokeless tobacco: Never  Vaping Use   Vaping status: Never Used  Substance and Sexual Activity   Alcohol use: Yes    Alcohol/week:  1.0 standard drink of alcohol    Types: 1 Glasses of wine per week   Drug use: No   Sexual activity: Yes    Birth control/protection: Condom, Surgical    Comment: BTL, ablation  Other Topics Concern   Not on file  Social History Narrative   Not on file   Social Drivers of Health   Financial Resource Strain: Not on file  Food Insecurity: Not on file  Transportation Needs: Not on file  Physical Activity: Not on file  Stress: Not on file  Social Connections: Not on file  Intimate Partner Violence: Not on file     Review of Systems: General: negative for chills, fever, night sweats or weight changes.  Cardiovascular: negative for chest pain, dyspnea on exertion, edema, orthopnea, palpitations, paroxysmal nocturnal dyspnea or shortness of breath Dermatological: negative for rash Respiratory: negative for cough or wheezing Urologic: negative for hematuria Abdominal: negative for nausea, vomiting, diarrhea, bright red blood per rectum, melena, or hematemesis Neurologic: negative for visual changes, syncope, or dizziness All other systems reviewed and are otherwise negative except as noted above.    Blood pressure (!) 150/80, pulse (!) 49, height 5' 6.5 (1.689 m), weight 247 lb (112 kg), SpO2 97%.  General appearance: alert and no distress Neck: no adenopathy, no carotid bruit, no JVD, supple, symmetrical, trachea midline, and  thyroid not enlarged, symmetric, no tenderness/mass/nodules Lungs: clear to auscultation bilaterally Heart: regular rate and rhythm, S1, S2 normal, no murmur, click, rub or gallop Extremities: extremities normal, atraumatic, no cyanosis or edema Pulses: 2+ and symmetric Skin: Skin color, texture, turgor normal. No rashes or lesions Neurologic: Grossly normal  EKG EKG Interpretation Date/Time:  Wednesday September 02 2024 09:58:48 EST Ventricular Rate:  49 PR Interval:  144 QRS Duration:  82 QT Interval:  446 QTC Calculation: 402 R Axis:   -16  Text  Interpretation: Sinus bradycardia When compared with ECG of 16-Sep-2014 12:07, No significant change was found Confirmed by Court Carrier [phone]) on 09/02/2024 10:15:03 AM    ASSESSMENT AND PLAN:   Hypertension History of essential hypertension blood pressure measured today at 150/80.  She is on hydrochlorothiazide and nifedipine.  She says her blood pressure at home is usually in the 120/75 range.  Hypercholesteremia History of hyperlipidemia previously on simvastatin now on rosuvastatin 20 mg a day.  Her most recent lipid profile performed 04/03/2024 revealed total cholesterol 262, LDL of 192 and HDL of 47.  Of note, her LP(a) was 258, significantly elevated.  I am going to recheck a lipid and liver profile in a month to assess efficacy of her rosuvastatin.  I am also going to obtain a coronary calcium score to restratify.  Morbid obesity (HCC) BMI 39.  Will refer to Pharm.D. to explore the possibility of a GLP-1 agonist.     Carrier DOROTHA Court MD [facility], [facility] 09/02/2024 10:33 AM

## 2024-09-02 NOTE — Assessment & Plan Note (Signed)
 BMI 39.  Will refer to Pharm.D. to explore the possibility of a GLP-1 agonist.

## 2024-09-02 NOTE — Assessment & Plan Note (Signed)
 History of essential hypertension blood pressure measured today at 150/80.  She is on hydrochlorothiazide and nifedipine.  She says her blood pressure at home is usually in the 120/75 range.

## 2024-09-02 NOTE — Assessment & Plan Note (Signed)
 History of hyperlipidemia previously on simvastatin now on rosuvastatin 20 mg a day.  Her most recent lipid profile performed 04/03/2024 revealed total cholesterol 262, LDL of 192 and HDL of 47.  Of note, her LP(a) was 258, significantly elevated.  I am going to recheck a lipid and liver profile in a month to assess efficacy of her rosuvastatin.  I am also going to obtain a coronary calcium score to restratify.

## 2024-09-03 ENCOUNTER — Other Ambulatory Visit: Payer: Self-pay

## 2024-09-03 ENCOUNTER — Telehealth: Payer: Self-pay

## 2024-09-03 NOTE — Telephone Encounter (Signed)
 Pt never seen here. KH

## 2024-09-03 NOTE — Telephone Encounter (Signed)
 hydrochlorothiazide (HYDRODIURIL) 25 MG tablet [[id number]]

## 2024-09-11 ENCOUNTER — Telehealth: Payer: Self-pay | Admitting: Cardiovascular Disease

## 2024-09-11 NOTE — Telephone Encounter (Signed)
 Patient signed a Release of Information for [facility]-to send records to us .  After several failed attempts to fax the document, Mollie and I called the facility. The registrar confirmed the fax number but also said that she did not see Ms. Reinig's name in their data base.  I called Ms. Roediger and left her a message to call me regarding records from [facility].  The Release that the patient filled out has been scanned into her chart in documents.

## 2024-09-28 ENCOUNTER — Ambulatory Visit (HOSPITAL_COMMUNITY)

## 2024-12-17 ENCOUNTER — Ambulatory Visit (HOSPITAL_BASED_OUTPATIENT_CLINIC_OR_DEPARTMENT_OTHER): Admitting: Cardiovascular Disease
